# Patient Record
Sex: Male | Born: 1985 | Race: White | Hispanic: No | Smoking: Former smoker
Health system: Southern US, Community
[De-identification: ages and names within clinical notes are randomized; demographics above are authoritative.]

## PROBLEM LIST (undated history)

## (undated) DIAGNOSIS — S3992XA Unspecified injury of lower back, initial encounter: Secondary | ICD-10-CM

## (undated) DIAGNOSIS — M549 Dorsalgia, unspecified: Secondary | ICD-10-CM

---

## 2006-10-20 ENCOUNTER — Emergency Department (HOSPITAL_COMMUNITY): Admission: EM | Admit: 2006-10-20 | Discharge: 2006-10-21 | Payer: Self-pay | Admitting: Emergency Medicine

## 2006-10-22 ENCOUNTER — Emergency Department (HOSPITAL_COMMUNITY): Admission: EM | Admit: 2006-10-22 | Discharge: 2006-10-22 | Payer: Self-pay | Admitting: Emergency Medicine

## 2007-01-14 ENCOUNTER — Emergency Department (HOSPITAL_COMMUNITY): Admission: EM | Admit: 2007-01-14 | Discharge: 2007-01-14 | Payer: Self-pay | Admitting: Emergency Medicine

## 2007-05-23 ENCOUNTER — Emergency Department (HOSPITAL_COMMUNITY): Admission: EM | Admit: 2007-05-23 | Discharge: 2007-05-23 | Payer: Self-pay | Admitting: Emergency Medicine

## 2010-11-07 ENCOUNTER — Emergency Department (HOSPITAL_COMMUNITY)
Admission: EM | Admit: 2010-11-07 | Discharge: 2010-11-07 | Disposition: A | Discharge status: Home / Self Care | Payer: Medicaid Other | Attending: Emergency Medicine | Admitting: Emergency Medicine

## 2010-11-07 ENCOUNTER — Emergency Department (HOSPITAL_COMMUNITY): Payer: Medicaid Other

## 2010-11-07 DIAGNOSIS — R059 Cough, unspecified: Secondary | ICD-10-CM | POA: Insufficient documentation

## 2010-11-07 DIAGNOSIS — J189 Pneumonia, unspecified organism: Secondary | ICD-10-CM | POA: Insufficient documentation

## 2010-11-07 DIAGNOSIS — F17200 Nicotine dependence, unspecified, uncomplicated: Secondary | ICD-10-CM | POA: Insufficient documentation

## 2010-11-07 DIAGNOSIS — R071 Chest pain on breathing: Secondary | ICD-10-CM | POA: Insufficient documentation

## 2010-11-07 DIAGNOSIS — J40 Bronchitis, not specified as acute or chronic: Secondary | ICD-10-CM | POA: Insufficient documentation

## 2010-11-07 DIAGNOSIS — R05 Cough: Secondary | ICD-10-CM | POA: Insufficient documentation

## 2011-01-22 ENCOUNTER — Other Ambulatory Visit (HOSPITAL_COMMUNITY): Payer: Self-pay | Admitting: Internal Medicine

## 2011-01-22 ENCOUNTER — Other Ambulatory Visit (HOSPITAL_COMMUNITY): Payer: Self-pay | Admitting: Family Medicine

## 2011-01-22 ENCOUNTER — Ambulatory Visit (HOSPITAL_COMMUNITY)
Admission: RE | Admit: 2011-01-22 | Discharge: 2011-01-22 | Disposition: A | Discharge status: Home / Self Care | Payer: Medicaid Other | Source: Ambulatory Visit | Attending: Family Medicine | Admitting: Family Medicine

## 2011-01-22 DIAGNOSIS — X58XXXA Exposure to other specified factors, initial encounter: Secondary | ICD-10-CM | POA: Insufficient documentation

## 2011-01-22 DIAGNOSIS — R52 Pain, unspecified: Secondary | ICD-10-CM

## 2011-01-22 DIAGNOSIS — S6990XA Unspecified injury of unspecified wrist, hand and finger(s), initial encounter: Secondary | ICD-10-CM | POA: Insufficient documentation

## 2011-01-22 DIAGNOSIS — R609 Edema, unspecified: Secondary | ICD-10-CM

## 2011-01-22 DIAGNOSIS — M25549 Pain in joints of unspecified hand: Secondary | ICD-10-CM | POA: Insufficient documentation

## 2011-12-15 ENCOUNTER — Emergency Department (HOSPITAL_COMMUNITY)
Admission: EM | Admit: 2011-12-15 | Discharge: 2011-12-15 | Disposition: A | Discharge status: Home / Self Care | Payer: Medicaid Other | Attending: Emergency Medicine | Admitting: Emergency Medicine

## 2011-12-15 ENCOUNTER — Encounter (HOSPITAL_COMMUNITY): Payer: Self-pay | Admitting: *Deleted

## 2011-12-15 DIAGNOSIS — W268XXA Contact with other sharp object(s), not elsewhere classified, initial encounter: Secondary | ICD-10-CM | POA: Insufficient documentation

## 2011-12-15 DIAGNOSIS — R05 Cough: Secondary | ICD-10-CM | POA: Insufficient documentation

## 2011-12-15 DIAGNOSIS — R059 Cough, unspecified: Secondary | ICD-10-CM | POA: Insufficient documentation

## 2011-12-15 DIAGNOSIS — IMO0002 Reserved for concepts with insufficient information to code with codable children: Secondary | ICD-10-CM

## 2011-12-15 DIAGNOSIS — S61209A Unspecified open wound of unspecified finger without damage to nail, initial encounter: Secondary | ICD-10-CM | POA: Insufficient documentation

## 2011-12-15 MED ORDER — KETOROLAC TROMETHAMINE 10 MG PO TABS
10.0000 mg | ORAL_TABLET | Freq: Once | ORAL | Status: AC
  Administered 2011-12-15: 10 mg via ORAL
  Filled 2011-12-15: qty 1

## 2011-12-15 MED ORDER — KETOROLAC TROMETHAMINE 10 MG PO TABS: ORAL_TABLET | ORAL | Status: DC

## 2011-12-15 NOTE — ED Provider Notes (Signed)
History     CSN: [id number]  Arrival date & time 12/15/11  1403   First MD Initiated Contact with Patient 12/15/11 1551      Chief Complaint  Patient presents with  . Laceration    (Consider location/radiation/quality/duration/timing/severity/associated sxs/prior treatment) Patient is a 26 y.o. male presenting with skin laceration. The history is provided by the patient.  Laceration  The incident occurred 3 to 5 hours ago. The laceration is located on the right hand. The laceration is 1 cm in size. The laceration mechanism was a a metal edge. The pain is moderate. The pain has been constant since onset. He reports no foreign bodies present. His tetanus status is UTD.    History reviewed. No pertinent past medical history.  History reviewed. No pertinent past surgical history.  History reviewed. No pertinent family history.  History  Substance Use Topics  . Smoking status: Current Everyday Smoker  . Smokeless tobacco: Not on file  . Alcohol Use: No      Review of Systems  Constitutional: Negative for activity change.       All ROS Neg except as noted in HPI  HENT: Negative for nosebleeds and neck pain.   Eyes: Negative for photophobia and discharge.  Respiratory: Positive for cough. Negative for shortness of breath and wheezing.   Cardiovascular: Negative for chest pain and palpitations.  Gastrointestinal: Negative for abdominal pain and blood in stool.  Genitourinary: Negative for dysuria, frequency and hematuria.  Musculoskeletal: Negative for back pain and arthralgias.  Skin: Negative.   Neurological: Negative for dizziness, seizures and speech difficulty.  Psychiatric/Behavioral: Negative for hallucinations and confusion.    Allergies  Poison sumac extract  Home Medications   Current Outpatient Rx  Name Route Sig Dispense Refill  . KETOROLAC TROMETHAMINE 10 MG PO TABS  1 po tid for soreness 12 tablet 0    BP 115/51  Pulse 72  Temp 98.1 F (36.7 C)  (Oral)  Resp 20  Ht 5\' 9"  (1.753 m)  Wt 130 lb (58.968 kg)  BMI 19.20 kg/m2  SpO2 100%  Physical Exam  Nursing note and vitals reviewed. Constitutional: He is oriented to person, place, and time. He appears well-developed and well-nourished.  Non-toxic appearance.  HENT:  Head: Normocephalic.  Right Ear: Tympanic membrane and external ear normal.  Left Ear: Tympanic membrane and external ear normal.  Eyes: EOM and lids are normal. Pupils are equal, round, and reactive to light.  Neck: Normal range of motion. Neck supple. Carotid bruit is not present.  Cardiovascular: Normal rate, regular rhythm, normal heart sounds, intact distal pulses and normal pulses.   Pulmonary/Chest: No respiratory distress. He has rhonchi.  Abdominal: Soft. Bowel sounds are normal. There is no tenderness. There is no guarding.  Musculoskeletal: Normal range of motion.       Hands: Lymphadenopathy:       Head (right side): No submandibular adenopathy present.       Head (left side): No submandibular adenopathy present.    He has no cervical adenopathy.  Neurological: He is alert and oriented to person, place, and time. He has normal strength. No cranial nerve deficit or sensory deficit.  Skin: Skin is warm and dry.  Psychiatric: He has a normal mood and affect. His speech is normal.    ED Course  LACERATION REPAIR Performed by: Kathie Dike Authorized by: Kathie Dike Consent: Verbal consent obtained. Risks and benefits: risks, benefits and alternatives were discussed Consent given by: patient Patient  understanding: patient states understanding of the procedure being performed Patient identity confirmed: verbally with patient Time out: Immediately prior to procedure a "time out" was called to verify the correct patient, procedure, equipment, support staff and site/side marked as required. Body area: upper extremity Location details: right small finger Laceration length: 1 cm Foreign bodies:  no foreign bodies Tendon involvement: none Nerve involvement: none Vascular damage: no Preparation: Patient was prepped and draped in the usual sterile fashion. Irrigation solution: tap water Amount of cleaning: standard Debridement: none Degree of undermining: none Skin closure: glue Approximation: close Patient tolerance: Patient tolerated the procedure well with no immediate complications.   (including critical care time)  Labs Reviewed - No data to display No results found.   1. Laceration       MDM  I have reviewed nursing notes, vital signs, and all appropriate lab and imaging results for this patient. Pt advised to keep the wound clean and dry. He is to return if any problem with the dermabond, or signs of infection.       Kathie Dike, PA 12/15/11 1625  Kathie Dike, [place] 12/15/11 [phone]

## 2011-12-15 NOTE — ED Notes (Signed)
Lac to rt little finger ,cut on metal,

## 2011-12-15 NOTE — Discharge Instructions (Signed)
Your wound was repaired with Dermabond. This will come off on its own in 5-7 days. Please do not get this area wet tonight. Please return if any changes consistent with infection.

## 2011-12-16 NOTE — ED Provider Notes (Signed)
Medical screening examination/treatment/procedure(s) were performed by non-physician practitioner and as supervising physician I was immediately available for consultation/collaboration.   Shelda Jakes, MD 12/16/11 [phone]

## 2012-08-09 ENCOUNTER — Encounter (HOSPITAL_COMMUNITY): Payer: Self-pay | Admitting: *Deleted

## 2012-08-09 ENCOUNTER — Emergency Department (HOSPITAL_COMMUNITY)
Admission: EM | Admit: 2012-08-09 | Discharge: 2012-08-09 | Disposition: A | Discharge status: Home / Self Care | Payer: Self-pay | Attending: Emergency Medicine | Admitting: Emergency Medicine

## 2012-08-09 DIAGNOSIS — L03211 Cellulitis of face: Secondary | ICD-10-CM | POA: Insufficient documentation

## 2012-08-09 DIAGNOSIS — F17200 Nicotine dependence, unspecified, uncomplicated: Secondary | ICD-10-CM | POA: Insufficient documentation

## 2012-08-09 DIAGNOSIS — L0201 Cutaneous abscess of face: Secondary | ICD-10-CM | POA: Insufficient documentation

## 2012-08-09 MED ORDER — HYDROCODONE-ACETAMINOPHEN 5-325 MG PO TABS: 2.0000 | ORAL_TABLET | Freq: Once | ORAL | Status: DC

## 2012-08-09 MED ORDER — CEPHALEXIN 500 MG PO CAPS: 500.0000 mg | ORAL_CAPSULE | Freq: Four times a day (QID) | ORAL | Status: DC

## 2012-08-09 MED ORDER — LIDOCAINE-EPINEPHRINE 1 %-1:200000 IJ SOLN
10.0000 mL | Freq: Once | INTRAMUSCULAR | Status: AC
Start: 2012-08-09 — End: 2012-08-09
  Administered 2012-08-09: 10 mL via INTRADERMAL
  Filled 2012-08-09: qty 10

## 2012-08-09 MED ORDER — HYDROCODONE-ACETAMINOPHEN 5-325 MG PO TABS
2.0000 | ORAL_TABLET | Freq: Once | ORAL | Status: AC
  Administered 2012-08-09: 2 via ORAL
  Filled 2012-08-09: qty 2

## 2012-08-09 MED ORDER — CEPHALEXIN 500 MG PO CAPS
500.0000 mg | ORAL_CAPSULE | Freq: Once | ORAL | Status: AC
  Administered 2012-08-09: 500 mg via ORAL
  Filled 2012-08-09: qty 1

## 2012-08-09 MED ORDER — SULFAMETHOXAZOLE-TMP DS 800-160 MG PO TABS
1.0000 | ORAL_TABLET | Freq: Once | ORAL | Status: AC
  Administered 2012-08-09: 1 via ORAL
  Filled 2012-08-09: qty 1

## 2012-08-09 MED ORDER — SULFAMETHOXAZOLE-TMP DS 800-160 MG PO TABS: 1.0000 | ORAL_TABLET | Freq: Once | ORAL | Status: DC

## 2012-08-09 NOTE — ED Notes (Signed)
Abscess to lt cheek, x 4 days

## 2012-08-09 NOTE — ED Provider Notes (Signed)
I  reviewed the resident's note and I agree with the findings and plan.   Benny Lennert, MD 08/09/12 [phone]

## 2012-08-09 NOTE — ED Provider Notes (Signed)
History     CSN: [id number]  Arrival date & time 08/09/12  2136   First MD Initiated Contact with Patient 08/09/12 2154      Chief Complaint  Patient presents with  . Abscess    (Consider location/radiation/quality/duration/timing/severity/associated sxs/prior treatment) Patient is a 27 y.o. male presenting with abscess. The history is provided by the patient.  Abscess Location:  Face Facial abscess location:  L cheek Size:  4 cm Abscess quality: fluctuance, induration, painful and warmth   Abscess quality: not draining and no redness   Red streaking: no   Duration:  4 days Progression:  Improving Pain details:    Quality:  Dull and aching   Severity:  Moderate   Duration:  4 days   History reviewed. No pertinent past medical history.  History reviewed. No pertinent past surgical history.  History reviewed. No pertinent family history.  History  Substance Use Topics  . Smoking status: Current Every Day Smoker  . Smokeless tobacco: Not on file  . Alcohol Use: No      Review of Systems  All other systems reviewed and are negative.    Allergies  Poison sumac extract  Home Medications  No current outpatient prescriptions on file.  BP 118/79  Pulse 95  Temp(Src) 98.9 F (37.2 C) (Oral)  Ht 5\' 9"  (1.753 m)  Wt 120 lb (54.432 kg)  BMI 17.71 kg/m2  SpO2 99%  Physical Exam  Constitutional: He is oriented to person, place, and time. He appears well-developed and well-nourished. No distress.  HENT:  Head: Normocephalic and atraumatic.  Nose:    Mouth/Throat: No oropharyngeal exudate.  Eyes: EOM are normal. Pupils are equal, round, and reactive to light.  Neck: Normal range of motion. Neck supple.  Cardiovascular: Normal rate and regular rhythm.  Exam reveals no friction rub.   No murmur heard. Pulmonary/Chest: Effort normal and breath sounds normal. No respiratory distress. He has no wheezes. He has no rales.  Abdominal: He exhibits no distension.  There is no tenderness. There is no rebound.  Musculoskeletal: Normal range of motion. He exhibits no edema.  Neurological: He is alert and oriented to person, place, and time.  Skin: He is not diaphoretic.    ED Course  INCISION AND DRAINAGE Date/Time: 08/09/2012 11:09 PM Performed by: Elwin Mocha Authorized by: Benny Lennert Consent: Verbal consent obtained. Type: abscess Body area: head/neck Location details: face Anesthesia: local infiltration Local anesthetic: lidocaine 1% with epinephrine Anesthetic total: 4 ml Patient sedated: no Risk factor: underlying major vessel Scalpel size: 11 Incision type: single straight Complexity: simple Drainage: purulent Drainage amount: scant Wound treatment: wound left open Patient tolerance: Patient tolerated the procedure well with no immediate complications.   (including critical care time)  Labs Reviewed - No data to display No results found.   1. Abscess of left external cheek       MDM   27 year old male with a left facial abscess. Present for 4 days. States he popped it and it was draining some, however unable to continue pumping and continue draining. No fevers, nausea, vomiting. No other systemic symptoms. No regional lymphadenopathy. I&D as above. We'll place on antibiotics and give pain medicine.  Elwin Mocha, MD 08/09/12 [phone]

## 2012-08-09 NOTE — ED Notes (Signed)
MD at bedside. For I&D to left cheek of face

## 2012-08-24 ENCOUNTER — Encounter (HOSPITAL_COMMUNITY): Payer: Self-pay | Admitting: *Deleted

## 2012-08-24 ENCOUNTER — Emergency Department (HOSPITAL_COMMUNITY)
Admission: EM | Admit: 2012-08-24 | Discharge: 2012-08-24 | Disposition: A | Discharge status: Home / Self Care | Payer: Self-pay | Attending: Emergency Medicine | Admitting: Emergency Medicine

## 2012-08-24 DIAGNOSIS — Y929 Unspecified place or not applicable: Secondary | ICD-10-CM | POA: Insufficient documentation

## 2012-08-24 DIAGNOSIS — W208XXA Other cause of strike by thrown, projected or falling object, initial encounter: Secondary | ICD-10-CM | POA: Insufficient documentation

## 2012-08-24 DIAGNOSIS — Y939 Activity, unspecified: Secondary | ICD-10-CM | POA: Insufficient documentation

## 2012-08-24 DIAGNOSIS — S61409A Unspecified open wound of unspecified hand, initial encounter: Secondary | ICD-10-CM | POA: Insufficient documentation

## 2012-08-24 DIAGNOSIS — Z79899 Other long term (current) drug therapy: Secondary | ICD-10-CM | POA: Insufficient documentation

## 2012-08-24 DIAGNOSIS — F17200 Nicotine dependence, unspecified, uncomplicated: Secondary | ICD-10-CM | POA: Insufficient documentation

## 2012-08-24 DIAGNOSIS — S61412A Laceration without foreign body of left hand, initial encounter: Secondary | ICD-10-CM

## 2012-08-24 MED ORDER — LIDOCAINE HCL (PF) 1 % IJ SOLN
INTRAMUSCULAR | Status: AC
  Filled 2012-08-24: qty 5

## 2012-08-24 MED ORDER — HYDROCODONE-ACETAMINOPHEN 5-325 MG PO TABS: 1.0000 | ORAL_TABLET | ORAL | Status: DC | PRN

## 2012-08-24 NOTE — ED Provider Notes (Signed)
History     CSN: [id number]  Arrival date & time 08/24/12  1350   First MD Initiated Contact with Patient 08/24/12 1412      Chief Complaint  Patient presents with  . Laceration    (Consider location/radiation/quality/duration/timing/severity/associated sxs/prior treatment) Patient is a 27 y.o. male presenting with skin laceration. The history is provided by the patient.  Laceration Location:  Hand Hand laceration location:  L finger Length (cm):  2 Quality: straight   Bleeding: controlled   Laceration mechanism:  Blunt object (transmission) Pain details:    Quality:  Aching   Severity:  Moderate   Timing:  Constant   Progression:  Worsening Foreign body present:  No foreign bodies Relieved by:  Nothing Worsened by:  Nothing tried Ineffective treatments:  None tried Tetanus status:  Up to date   History reviewed. No pertinent past medical history.  History reviewed. No pertinent past surgical history.  History reviewed. No pertinent family history.  History  Substance Use Topics  . Smoking status: Current Every Day Smoker  . Smokeless tobacco: Not on file  . Alcohol Use: No      Review of Systems  Constitutional: Negative for activity change.       All ROS Neg except as noted in HPI  HENT: Negative for nosebleeds and neck pain.   Eyes: Negative for photophobia and discharge.  Respiratory: Negative for cough, shortness of breath and wheezing.   Cardiovascular: Negative for chest pain and palpitations.  Gastrointestinal: Negative for abdominal pain and blood in stool.  Genitourinary: Negative for dysuria, frequency and hematuria.  Musculoskeletal: Negative for back pain and arthralgias.  Skin: Negative.   Neurological: Negative for dizziness, seizures and speech difficulty.  Psychiatric/Behavioral: Negative for hallucinations and confusion.    Allergies  Poison sumac extract  Home Medications   Current Outpatient Rx  Name  Route  Sig  Dispense   Refill  . HYDROcodone-acetaminophen (NORCO/VICODIN) 5-325 MG per tablet   Oral   Take 2 tablets by mouth once.   20 tablet   0   . cephALEXin (KEFLEX) 500 MG capsule   Oral   Take 1 capsule (500 mg total) by mouth 4 (four) times daily.   40 capsule   0   . sulfamethoxazole-trimethoprim (BACTRIM DS) 800-160 MG per tablet   Oral   Take 1 tablet by mouth once.   20 tablet   0     BP 113/91  Pulse 99  Temp(Src) 97.9 F (36.6 C) (Oral)  Resp 16  Ht 5\' 9"  (1.753 m)  Wt 130 lb (58.968 kg)  BMI 19.19 kg/m2  SpO2 96%  Physical Exam  Nursing note and vitals reviewed. Constitutional: He is oriented to person, place, and time. He appears well-developed and well-nourished.  Non-toxic appearance.  HENT:  Head: Normocephalic.  Right Ear: Tympanic membrane and external ear normal.  Left Ear: Tympanic membrane and external ear normal.  Eyes: EOM and lids are normal. Pupils are equal, round, and reactive to light.  Neck: Normal range of motion. Neck supple. Carotid bruit is not present.  Cardiovascular: Normal rate, regular rhythm, normal heart sounds, intact distal pulses and normal pulses.   Pulmonary/Chest: Breath sounds normal. No respiratory distress.  Scattered rhonchi present.  Abdominal: Soft. Bowel sounds are normal. There is no tenderness. There is no guarding.  Musculoskeletal: Normal range of motion.  Patient has a one to 2 cm laceration of the palmar surface of the middle finger of the left hand at  the base. There is no bone or tendon involvement. There is full range of motion of the middle finger on the left. Sensory is intact. There is an abrasion to the dorsal third MP joint. There is no palpable deformity of the left wrist or the left hand. Capillary refill is less than 3 seconds.  Lymphadenopathy:       Head (right side): No submandibular adenopathy present.       Head (left side): No submandibular adenopathy present.    He has no cervical adenopathy.  Neurological:  He is alert and oriented to person, place, and time. He has normal strength. No cranial nerve deficit or sensory deficit.  Skin: Skin is warm and dry.  Psychiatric: He has a normal mood and affect. His speech is normal.    ED Course  Procedures  : FINGER LACERATION REPAIR. Patient identified by arm band. Permission for the procedure is given by the patient. Procedural time out was taken before repair of laceration to the third finger of the left hand. The left hand was soaked in a solution of Betadine and saline 6-7 minutes. The hand was then cleansed of debris, and the laceration was infiltrated with 1% plain lidocaine. After adequate anesthetic, the wound was irrigated with saline, it was inspected, no foreign body was noted, no bone or tendon involvement noted. The wound was then repaired with 2 interrupted sutures of 4-0 nylon. The wound measures  1.3CM. sterile dressing was applied by me. Patient tolerated the procedure without problem. The patient is up-to-date on his tetanus.  Labs Reviewed - No data to display No results found.   No diagnosis found.    MDM  I have reviewed nursing notes, vital signs, and all appropriate lab and imaging results for this patient. Patient states he had a" transmission from a car" to perform partially on his left hand. He sustained a laceration to the hand. The area was slow to stop bleeding it was causing him some pain and he came to the emergency department for evaluation. The wound is cleansed and then repaired. The patient's tetanus status is up-to-date. The patient is placed on 10 tablets of Norco, one every 4 hours for soreness. Patient is to have the sutures removed in 7 days. Patient is to return sooner if any changes or problems consistent with infection.       Kathie Dike, PA-C 08/25/12 [phone]

## 2012-08-24 NOTE — ED Notes (Signed)
Pt states a transmission of a car fell on hand, lac noted to middle finger of left hand

## 2012-08-24 NOTE — ED Notes (Addendum)
Lac to LMF when working on a transmission. Today.  Lac to proximal phalanx , palmar surface.  Band aid by Loney Laurence after  Suturing.

## 2012-08-25 NOTE — ED Provider Notes (Signed)
Medical screening examination/treatment/procedure(s) were performed by non-physician practitioner and as supervising physician I was immediately available for consultation/collaboration.   Joya Gaskins, MD 08/25/12 1052

## 2012-10-01 ENCOUNTER — Emergency Department (HOSPITAL_COMMUNITY)
Admission: EM | Admit: 2012-10-01 | Discharge: 2012-10-01 | Disposition: A | Discharge status: Home / Self Care | Payer: Self-pay | Attending: Emergency Medicine | Admitting: Emergency Medicine

## 2012-10-01 ENCOUNTER — Emergency Department (HOSPITAL_COMMUNITY): Payer: Self-pay

## 2012-10-01 ENCOUNTER — Encounter (HOSPITAL_COMMUNITY): Payer: Self-pay | Admitting: Emergency Medicine

## 2012-10-01 DIAGNOSIS — S6990XA Unspecified injury of unspecified wrist, hand and finger(s), initial encounter: Secondary | ICD-10-CM | POA: Insufficient documentation

## 2012-10-01 DIAGNOSIS — Y929 Unspecified place or not applicable: Secondary | ICD-10-CM | POA: Insufficient documentation

## 2012-10-01 DIAGNOSIS — F17200 Nicotine dependence, unspecified, uncomplicated: Secondary | ICD-10-CM | POA: Insufficient documentation

## 2012-10-01 DIAGNOSIS — S6980XA Other specified injuries of unspecified wrist, hand and finger(s), initial encounter: Secondary | ICD-10-CM | POA: Insufficient documentation

## 2012-10-01 DIAGNOSIS — W208XXA Other cause of strike by thrown, projected or falling object, initial encounter: Secondary | ICD-10-CM | POA: Insufficient documentation

## 2012-10-01 DIAGNOSIS — S6992XD Unspecified injury of left wrist, hand and finger(s), subsequent encounter: Secondary | ICD-10-CM

## 2012-10-01 DIAGNOSIS — Y939 Activity, unspecified: Secondary | ICD-10-CM | POA: Insufficient documentation

## 2012-10-01 DIAGNOSIS — IMO0002 Reserved for concepts with insufficient information to code with codable children: Secondary | ICD-10-CM

## 2012-10-01 NOTE — ED Notes (Signed)
Pt states he was seen here about a month ago for injury to his L middle finger. Pt states a transmission fell on his hand. Pt had lac that required sutures. Pt states he has had edema and pain since.

## 2012-10-03 NOTE — ED Provider Notes (Signed)
History     CSN: [id number]  Arrival date & time 10/01/12  1551   First MD Initiated Contact with Patient 10/01/12 1612      Chief Complaint  Patient presents with  . Finger Injury    (Consider location/radiation/quality/duration/timing/severity/associated sxs/prior treatment) HPI Comments: Patient c/o continued pain and edema to the left third finger.  States he was seen here approximately one month ago and treated for a laceration to the proximal left third finger.  He states the wound healed, but continues to have swelling of the finger and difficulty fully flexing the finger.  He denies numbness of the finger, wrist pain, or erythema.  He states he did not f/u with orthopedics.  Patient is a 27 y.o. male presenting with hand pain. The history is provided by the patient.  Hand Pain This is a chronic problem. The current episode started more than 1 month ago. The problem occurs constantly. The problem has been unchanged. Associated symptoms include arthralgias and joint swelling. Pertinent negatives include no chills, fever, headaches, neck pain, numbness, rash, sore throat, vomiting or weakness. Exacerbated by: movement. He has tried nothing for the symptoms. The treatment provided no relief.    History reviewed. No pertinent past medical history.  History reviewed. No pertinent past surgical history.  History reviewed. No pertinent family history.  History  Substance Use Topics  . Smoking status: Current Every Day Smoker  . Smokeless tobacco: Not on file  . Alcohol Use: No      Review of Systems  Constitutional: Negative for fever and chills.  HENT: Negative for sore throat and neck pain.   Gastrointestinal: Negative for vomiting.  Genitourinary: Negative for dysuria and difficulty urinating.  Musculoskeletal: Positive for joint swelling and arthralgias. Negative for back pain and gait problem.  Skin: Negative for color change, rash and wound.  Neurological: Negative  for weakness, numbness and headaches.  All other systems reviewed and are negative.    Allergies  Poison sumac extract  Home Medications   Current Outpatient Rx  Name  Route  Sig  Dispense  Refill  . acetaminophen (TYLENOL) 500 MG tablet   Oral   Take 1,000 mg by mouth daily as needed for pain.         Marland Kitchen ibuprofen (ADVIL,MOTRIN) 200 MG tablet   Oral   Take 400-600 mg by mouth daily as needed for pain.           BP 121/65  Pulse 67  Temp(Src) 98.2 F (36.8 C) (Oral)  Ht 5\' 9"  (1.753 m)  Wt 140 lb (63.504 kg)  BMI 20.67 kg/m2  SpO2 100%  Physical Exam  Nursing note and vitals reviewed. Constitutional: He is oriented to person, place, and time. He appears well-developed and well-nourished. No distress.  HENT:  Head: Normocephalic and atraumatic.  Cardiovascular: Normal rate, regular rhythm and normal heart sounds.   Pulmonary/Chest: Effort normal and breath sounds normal.  Musculoskeletal: He exhibits edema and tenderness.       Left hand: He exhibits decreased range of motion, tenderness and swelling. He exhibits no bony tenderness, normal two-point discrimination, normal capillary refill, no deformity and no laceration. Normal sensation noted. Decreased strength noted. He exhibits no thumb/finger opposition and no wrist extension trouble.       Hands: Mild ttp of the left third finger with attempted flexion.  Mild STS of the DIP joint.   Radial pulse is brisk, distal sensation intact.  CR< 2 sec.  No bruising or bony  deformity.  Previous laceration appears well healed.    Neurological: He is alert and oriented to person, place, and time. He exhibits normal muscle tone. Coordination normal.  Skin: Skin is warm and dry.    ED Course  Procedures (including critical care time)  Labs Reviewed - No data to display No results found. Dg Finger Middle Left  10/01/2012  *RADIOLOGY REPORT*  Clinical Data: Finger injury.  LEFT MIDDLE FINGER 2+V  Comparison: None  Findings:  No acute bony abnormality.  Specifically, no fracture, subluxation, or dislocation.  Soft tissues are intact. Joint spaces are maintained.  Normal bone mineralization.  IMPRESSION: Normal study.   Original Report Authenticated By: Charlett Nose, M.D.     1. Finger injury, left, subsequent encounter   2. Tendon injury     Left middle finger splinted,  Pain improved.  NV intact  MDM    Patient with likely flexion tendon injury secondary to previous laceration.  X-ray negative for bony injury. No clinical sx's of infection.   Will splint finger and pt agrees to f/u with orthopedics.  Given referral info.    The patient appears reasonably screened and/or stabilized for discharge and I doubt any other medical condition or other [facility] requiring further screening, evaluation, or treatment in the ED at this time prior to discharge.       Creola Krotz L. Trisha Mangle, PA-C 10/03/12 2005

## 2012-10-06 NOTE — ED Provider Notes (Signed)
Medical screening examination/treatment/procedure(s) were performed by non-physician practitioner and as supervising physician I was immediately available for consultation/collaboration. Devoria Albe, MD, FACEP   Ward Givens, MD 10/06/12 1053

## 2013-03-02 ENCOUNTER — Emergency Department (HOSPITAL_COMMUNITY)
Admission: EM | Admit: 2013-03-02 | Discharge: 2013-03-02 | Disposition: A | Discharge status: Home / Self Care | Payer: Self-pay | Attending: Emergency Medicine | Admitting: Emergency Medicine

## 2013-03-02 ENCOUNTER — Encounter (HOSPITAL_COMMUNITY): Payer: Self-pay

## 2013-03-02 DIAGNOSIS — Y929 Unspecified place or not applicable: Secondary | ICD-10-CM | POA: Insufficient documentation

## 2013-03-02 DIAGNOSIS — S8990XA Unspecified injury of unspecified lower leg, initial encounter: Secondary | ICD-10-CM | POA: Insufficient documentation

## 2013-03-02 DIAGNOSIS — F17200 Nicotine dependence, unspecified, uncomplicated: Secondary | ICD-10-CM | POA: Insufficient documentation

## 2013-03-02 DIAGNOSIS — L03039 Cellulitis of unspecified toe: Secondary | ICD-10-CM | POA: Insufficient documentation

## 2013-03-02 DIAGNOSIS — W208XXA Other cause of strike by thrown, projected or falling object, initial encounter: Secondary | ICD-10-CM | POA: Insufficient documentation

## 2013-03-02 DIAGNOSIS — Y9389 Activity, other specified: Secondary | ICD-10-CM | POA: Insufficient documentation

## 2013-03-02 DIAGNOSIS — L03012 Cellulitis of left finger: Secondary | ICD-10-CM

## 2013-03-02 MED ORDER — IBUPROFEN 800 MG PO TABS
800.0000 mg | ORAL_TABLET | Freq: Once | ORAL | Status: AC
  Administered 2013-03-02: 800 mg via ORAL
  Filled 2013-03-02: qty 1

## 2013-03-02 MED ORDER — CEPHALEXIN 500 MG PO CAPS: 500.0000 mg | ORAL_CAPSULE | Freq: Four times a day (QID) | ORAL | Status: DC

## 2013-03-02 NOTE — ED Notes (Signed)
Pt reports left great toe pain for 2 days, denies any known injury

## 2013-03-02 NOTE — ED Provider Notes (Signed)
CSN: [id number]     Arrival date & time 03/02/13  4098 History  This chart was scribed for Candyce Churn, MD by Quintella Reichert, ED scribe.  This patient was seen in room [facility]11/[facility]11 and the patient's care was started at 8:49 AM.   Chief Complaint  Patient presents with  . Toe Pain    Patient is a 27 y.o. male presenting with foot injury. The history is provided by the patient. No language interpreter was used.  Foot Injury Location:  Toe Time since incident:  5 days Injury: yes   Mechanism of injury comment:  Dropped a floor jack on the toe Toe location:  L big toe Pain details:    Quality:  Throbbing   Severity:  Moderate   Duration:  2 days   Timing:  Constant Chronicity:  New Dislocation: no   Foreign body present:  No foreign bodies Relieved by:  Nothing Worsened by:  Bearing weight Ineffective treatments:  None tried Associated symptoms: no fever, no muscle weakness, no numbness and no tingling     HPI Comments: Edward Krause is a 27 y.o. male who presents to the Emergency Department complaining of gradual-onset, gradually-worsening left great toe pain that began 2 days ago.  5 days ago pt dropped a floor jack on the toe.  He did not immediately develop symptoms but 2 days ago he developed constant, moderate-to-severe throbbing pain to the toe that is greatly exacerbated by bearing weight.  Presently he rates pain at a severity of 8/10.  He also notes some mild visible swelling to the toe.  He denies joint pain, weakness, numbness or tingling.  He denies fever.   History reviewed. No pertinent past medical history.  History reviewed. No pertinent past surgical history.  No family history on file.  History  Substance Use Topics  . Smoking status: Current Every Day Smoker  . Smokeless tobacco: Not on file  . Alcohol Use: Yes     Comment: occ     Review of Systems  Constitutional: Negative for fever.  Musculoskeletal: Negative for arthralgias.       Left  great toe pain  Neurological: Negative for weakness and numbness.  All other systems reviewed and are negative.     Allergies  Poison sumac extract  Home Medications  No current outpatient prescriptions on file.  BP 117/75  Pulse 75  Temp(Src) 97.4 F (36.3 C) (Oral)  Resp 20  SpO2 100%  Physical Exam  Nursing note and vitals reviewed. Constitutional: He is oriented to person, place, and time. He appears well-developed and well-nourished. No distress.  HENT:  Head: Normocephalic and atraumatic.  Eyes: Conjunctivae are normal. No scleral icterus.  Neck: Neck supple.  Cardiovascular: Normal rate and intact distal pulses.   Pulmonary/Chest: Effort normal. No stridor. No respiratory distress.  Abdominal: Normal appearance. He exhibits no distension.  Neurological: He is alert and oriented to person, place, and time.  Skin: Skin is warm and dry. No rash noted.  Mild amount of erythema on lateral aspect of left big toe, no significant swelling, no fluctuance.  Psychiatric: He has a normal mood and affect. His behavior is normal.    ED Course  Procedures (including critical care time)  DIAGNOSTIC STUDIES: Oxygen Saturation is 100% on room air, normal by my interpretation.    COORDINATION OF CARE: 8:52 AM: Informed pt that symptoms are likely due to paronychia. Discussed treatment plan which includes ibuprofen, warm soaks, and return for I&D if  symptoms worsen or pt develops fever.  Pt expressed understanding and agreed to plan.   Labs Review Labs Reviewed - No data to display  Imaging Review No results found.  MDM   1. Paronychia, left    27 year old male with left great toe pain. Exam consistent with developing paronychia. No fluctuance or abscess to be drained. Will treat with warm soaks and Keflex. Return precautions given.    I personally performed the services described in this documentation, which was scribed in my presence. The recorded information has been  reviewed and is accurate.     Candyce Churn, MD 03/02/13 [phone]

## 2013-03-04 ENCOUNTER — Emergency Department (HOSPITAL_COMMUNITY)
Admission: EM | Admit: 2013-03-04 | Discharge: 2013-03-04 | Disposition: A | Discharge status: Home / Self Care | Payer: Self-pay | Attending: Emergency Medicine | Admitting: Emergency Medicine

## 2013-03-04 ENCOUNTER — Encounter (HOSPITAL_COMMUNITY): Payer: Self-pay | Admitting: *Deleted

## 2013-03-04 DIAGNOSIS — L02612 Cutaneous abscess of left foot: Secondary | ICD-10-CM

## 2013-03-04 DIAGNOSIS — L02619 Cutaneous abscess of unspecified foot: Secondary | ICD-10-CM | POA: Insufficient documentation

## 2013-03-04 DIAGNOSIS — F17200 Nicotine dependence, unspecified, uncomplicated: Secondary | ICD-10-CM | POA: Insufficient documentation

## 2013-03-04 DIAGNOSIS — L03039 Cellulitis of unspecified toe: Secondary | ICD-10-CM | POA: Insufficient documentation

## 2013-03-04 DIAGNOSIS — Z792 Long term (current) use of antibiotics: Secondary | ICD-10-CM | POA: Insufficient documentation

## 2013-03-04 MED ORDER — HYDROCODONE-ACETAMINOPHEN 5-325 MG PO TABS: 1.0000 | ORAL_TABLET | ORAL | Status: DC | PRN

## 2013-03-04 MED ORDER — BUPIVACAINE HCL (PF) 0.5 % IJ SOLN
10.0000 mL | Freq: Once | INTRAMUSCULAR | Status: AC
  Administered 2013-03-04: 10 mL
  Filled 2013-03-04: qty 30

## 2013-03-04 NOTE — ED Provider Notes (Signed)
CSN: [id number]     Arrival date & time 03/04/13  1618 History   First MD Initiated Contact with Patient 03/04/13 1652     Chief Complaint  Patient presents with  . Toe Pain   (Consider location/radiation/quality/duration/timing/severity/associated sxs/prior Treatment) HPI Comments: Patient returns after having been seen here 2 days ago for the same thing - reports that he was given abx which have not helped - states that the toe has become more swollen and painful - reports there is a white "head" on the medial aspect of the toe - denies fever or chills but reports pain with ambulation and pressure.  Patient is a 27 y.o. male presenting with toe pain. The history is provided by the patient. No language interpreter was used.  Toe Pain This is a new problem. The current episode started in the past 7 days. The problem occurs constantly. The problem has been gradually worsening. Associated symptoms include arthralgias, joint swelling and myalgias. Pertinent negatives include no anorexia, chest pain, chills, coughing, fever, neck pain, numbness, sore throat, swollen glands, urinary symptoms or weakness. The symptoms are aggravated by bending and walking. He has tried nothing for the symptoms. The treatment provided no relief.    History reviewed. No pertinent past medical history. History reviewed. No pertinent past surgical history. History reviewed. No pertinent family history. History  Substance Use Topics  . Smoking status: Current Every Day Smoker  . Smokeless tobacco: Not on file  . Alcohol Use: Yes     Comment: occ    Review of Systems  Constitutional: Negative for fever and chills.  HENT: Negative for sore throat and neck pain.   Respiratory: Negative for cough.   Cardiovascular: Negative for chest pain.  Gastrointestinal: Negative for anorexia.  Musculoskeletal: Positive for myalgias, joint swelling and arthralgias.  Neurological: Negative for weakness and numbness.  All other  systems reviewed and are negative.    Allergies  Poison sumac extract  Home Medications   Current Outpatient Rx  Name  Route  Sig  Dispense  Refill  . cephALEXin (KEFLEX) 500 MG capsule   Oral   Take 1 capsule (500 mg total) by mouth 4 (four) times daily.   28 capsule   0    BP 112/72  Pulse 89  Temp(Src) 98.1 F (36.7 C) (Oral)  Resp 18  Ht 5\' 9"  (1.753 m)  Wt 120 lb (54.432 kg)  BMI 17.71 kg/m2  SpO2 100% Physical Exam  Nursing note and vitals reviewed. Constitutional: He is oriented to person, place, and time. He appears well-developed and well-nourished. No distress.  HENT:  Head: Normocephalic and atraumatic.  Mouth/Throat: Oropharynx is clear and moist.  Eyes: Conjunctivae are normal. Pupils are equal, round, and reactive to light. No scleral icterus.  Neck: Normal range of motion. Neck supple.  Pulmonary/Chest: Effort normal.  Abdominal: There is no tenderness.  Musculoskeletal: He exhibits edema and tenderness.       Left foot: He exhibits tenderness and swelling. He exhibits no bony tenderness and normal capillary refill.       Feet:  Neurological: He is alert and oriented to person, place, and time. No cranial nerve deficit.  Skin: Skin is warm and dry. No rash noted. There is erythema. No pallor.  Erythema noted to medial aspect of left great toe  Psychiatric: He has a normal mood and affect. His behavior is normal. Judgment and thought content normal.    ED Course  Procedures (including critical care time) Labs Review  Labs Reviewed - No data to display Imaging Review No results found. INCISION AND DRAINAGE Performed by: Cherrie Distance C. Consent: Verbal consent obtained. Risks and benefits: risks, benefits and alternatives were discussed Type: abscess  Body area: left great toe  Anesthesia: digital block  Incision was made with a scalpel.  Local anesthetic: marcaine 0.5% without epi  Anesthetic total: 5 ml  Complexity: complex Blunt  dissection to break up loculations  Drainage: purulent  Drainage amount: small  Packing material: No packing placed  Patient tolerance: Patient tolerated the procedure well with no immediate complications.   MDM  Left great toe abscess  Patient with abscess to left great toe - though no paronychia noted at this time - does not appear to go deep into soft tissue like a felon.  Opened with small amount of drainage - will continue abx and do twice daily soaks.  Will give short course pain medication since I&D   Scarlette Calico C. Marisue Humble, PA-C 03/04/13 1754

## 2013-03-04 NOTE — ED Notes (Signed)
Pain lt great toe

## 2013-03-04 NOTE — ED Provider Notes (Signed)
Medical screening examination/treatment/procedure(s) were performed by non-physician practitioner and as supervising physician I was immediately available for consultation/collaboration.  Donnetta Hutching, MD 03/04/13 [phone]

## 2013-03-27 ENCOUNTER — Emergency Department (HOSPITAL_COMMUNITY)
Admission: EM | Admit: 2013-03-27 | Discharge: 2013-03-27 | Disposition: A | Discharge status: Home / Self Care | Payer: Self-pay | Attending: Emergency Medicine | Admitting: Emergency Medicine

## 2013-03-27 ENCOUNTER — Encounter (HOSPITAL_COMMUNITY): Payer: Self-pay | Admitting: Emergency Medicine

## 2013-03-27 DIAGNOSIS — L03019 Cellulitis of unspecified finger: Secondary | ICD-10-CM | POA: Insufficient documentation

## 2013-03-27 DIAGNOSIS — L02511 Cutaneous abscess of right hand: Secondary | ICD-10-CM

## 2013-03-27 DIAGNOSIS — F17200 Nicotine dependence, unspecified, uncomplicated: Secondary | ICD-10-CM | POA: Insufficient documentation

## 2013-03-27 DIAGNOSIS — L02519 Cutaneous abscess of unspecified hand: Secondary | ICD-10-CM | POA: Insufficient documentation

## 2013-03-27 MED ORDER — LIDOCAINE HCL (PF) 1 % IJ SOLN
INTRAMUSCULAR | Status: AC
  Filled 2013-03-27: qty 5

## 2013-03-27 MED ORDER — HYDROCODONE-ACETAMINOPHEN 5-325 MG PO TABS
1.0000 | ORAL_TABLET | Freq: Once | ORAL | Status: AC
  Administered 2013-03-27: 1 via ORAL
  Filled 2013-03-27: qty 1

## 2013-03-27 MED ORDER — SULFAMETHOXAZOLE-TRIMETHOPRIM 800-160 MG PO TABS: 1.0000 | ORAL_TABLET | Freq: Two times a day (BID) | ORAL | Status: DC

## 2013-03-27 MED ORDER — SULFAMETHOXAZOLE-TMP DS 800-160 MG PO TABS
1.0000 | ORAL_TABLET | Freq: Once | ORAL | Status: AC
  Administered 2013-03-27: 1 via ORAL
  Filled 2013-03-27: qty 1

## 2013-03-27 MED ORDER — HYDROCODONE-ACETAMINOPHEN 5-325 MG PO TABS: ORAL_TABLET | ORAL | Status: DC

## 2013-03-27 MED ORDER — CEPHALEXIN 500 MG PO CAPS: 500.0000 mg | ORAL_CAPSULE | Freq: Four times a day (QID) | ORAL | Status: DC

## 2013-03-27 NOTE — ED Provider Notes (Signed)
CSN: [id number]     Arrival date & time 03/27/13  1528 History   First MD Initiated Contact with Patient 03/27/13 1615     Chief Complaint  Patient presents with  . Abscess   (Consider location/radiation/quality/duration/timing/severity/associated sxs/prior Treatment) HPI Comments: Edward Krause is a 27 y.o. male who presents to the Emergency Department complaining of pain, redness and swelling to the dorsal right thumb for 1.5 weeks.  Stats the area began as a small pimple and has now began painful with movement.  He states he tried squeezing it several days ago and pierced it with a needle this morning, but the area did not bleed or drain.  Pain is worse with attempted movement of the thumb.  He denies fever, chills, or numbness of the finger.  He has hx of similar appearing lesions in the past, but has not been diagnosed with MRSA  Patient is a 27 y.o. male presenting with hand pain. The history is provided by the patient.  Hand Pain This is a new problem. Episode onset: 1.5 weeks ago. The problem occurs constantly. The problem has been gradually worsening. Pertinent negatives include no arthralgias, chills, fever, headaches, joint swelling, nausea, numbness, rash, sore throat, swollen glands, vomiting or weakness. Associated symptoms comments: Pain, redness and localized swelling of the right dorsal thumb. The symptoms are aggravated by bending (palpation). The treatment provided no relief.    History reviewed. No pertinent past medical history. History reviewed. No pertinent past surgical history. History reviewed. No pertinent family history. History  Substance Use Topics  . Smoking status: Current Every Day Smoker  . Smokeless tobacco: Not on file  . Alcohol Use: Yes     Comment: occ    Review of Systems  Constitutional: Negative for fever and chills.  HENT: Negative for sore throat.   Gastrointestinal: Negative for nausea and vomiting.  Musculoskeletal: Negative for  arthralgias and joint swelling.  Skin: Positive for color change. Negative for rash.       Abscess of right thumb  Neurological: Negative for weakness, numbness and headaches.  Hematological: Negative for adenopathy.  All other systems reviewed and are negative.    Allergies  Poison sumac extract  Home Medications  No current outpatient prescriptions on file. BP 119/73  Pulse 93  Temp(Src) 98.5 F (36.9 C) (Oral)  Resp 19  SpO2 100% Physical Exam  Nursing note and vitals reviewed. Constitutional: He is oriented to person, place, and time. He appears well-developed and well-nourished. No distress.  HENT:  Head: Normocephalic and atraumatic.  Cardiovascular: Normal rate, regular rhythm and normal heart sounds.   No murmur heard. Pulmonary/Chest: Effort normal and breath sounds normal. No respiratory distress.  Musculoskeletal: He exhibits tenderness. He exhibits no edema.       Right hand: He exhibits tenderness. He exhibits no bony tenderness and normal two-point discrimination. Normal sensation noted.       Hands: Neurological: He is alert and oriented to person, place, and time. He exhibits normal muscle tone. Coordination normal.  Skin: Skin is warm and dry. There is erythema.  Localized area of induration and erythema of the dorsal aspect of the right mid thumb.  Pt has limited finger/thumb opposition secondary to pain.  Pain also reproduced with full extension.  Distal sensation intact.  CR< 2 sec.  Radial pulse brisk, no lymphangitis, fluctuance or drainage    ED Course  Procedures (including critical care time) Labs Review Labs Reviewed - No data to display Imaging Review No  results found.  Leading edge of erythema was marked by me.  No indication for I&D at this time.    MDM    1635  Pt also seen by EDP, Dr. Fleet Contras, care plan discussed with the patient and includes warm wet soaks 3 times/day, bactrim and keflex, and referral to hand surgeon.  Pt also advised  of signs and sx's for ER return.  Pt agrees to care plan and verbalized understanding  Vihana Kydd L. Trisha Mangle, PA-C 03/27/13 2010

## 2013-03-27 NOTE — ED Notes (Signed)
Pt had bump on posterior right thumb x 1 week ago. Started swelling x 3 days ago. Moderate swelling noted with redness and tightness to right thumb. Unable to bend per pt.

## 2013-03-27 NOTE — ED Notes (Signed)
Patient presents to ED complaining of a "bump" on his right thumb. Patient states that it "just showed up about 1 1/2 ago and I tried to pop it but couldn't". Patient states pain 8/10 on the right thumb as burning and throbbing that radiates up to his elbow. Patient can move all of his fingers and his thumb, but has obvious pain when bending thumb. Size is approx a quarter, reddened and swollen. Sensation in tact. Alert and oriented X4.  Patient in no apparent distress.

## 2013-03-27 NOTE — ED Provider Notes (Signed)
Pt with thumb infection - on dorsal surface - no fluctuance, no ascending redness.  Sx are moderate, worse with palpation.  No fever, no signs of ascending lymphangitis.  Medical screening examination/treatment/procedure(s) were conducted as a shared visit with non-physician practitioner(s) and myself.  I personally evaluated the patient during the encounter.  Abx  Vida Roller, MD 03/27/13 2248

## 2013-07-11 ENCOUNTER — Encounter (HOSPITAL_COMMUNITY): Payer: Self-pay | Admitting: Emergency Medicine

## 2013-07-11 ENCOUNTER — Emergency Department (HOSPITAL_COMMUNITY)
Admission: EM | Admit: 2013-07-11 | Discharge: 2013-07-11 | Disposition: A | Discharge status: Home / Self Care | Payer: Self-pay | Attending: Emergency Medicine | Admitting: Emergency Medicine

## 2013-07-11 DIAGNOSIS — L0231 Cutaneous abscess of buttock: Secondary | ICD-10-CM | POA: Insufficient documentation

## 2013-07-11 DIAGNOSIS — F17200 Nicotine dependence, unspecified, uncomplicated: Secondary | ICD-10-CM | POA: Insufficient documentation

## 2013-07-11 DIAGNOSIS — L03317 Cellulitis of buttock: Principal | ICD-10-CM

## 2013-07-11 DIAGNOSIS — Z792 Long term (current) use of antibiotics: Secondary | ICD-10-CM | POA: Insufficient documentation

## 2013-07-11 MED ORDER — AMOXICILLIN-POT CLAVULANATE 875-125 MG PO TABS: 1.0000 | ORAL_TABLET | Freq: Two times a day (BID) | ORAL | Status: DC

## 2013-07-11 MED ORDER — METRONIDAZOLE 500 MG PO TABS: 500.0000 mg | ORAL_TABLET | Freq: Three times a day (TID) | ORAL | Status: DC

## 2013-07-11 MED ORDER — HYDROCODONE-ACETAMINOPHEN 5-325 MG PO TABS: 1.0000 | ORAL_TABLET | ORAL | Status: DC | PRN

## 2013-07-11 MED ORDER — SULFAMETHOXAZOLE-TRIMETHOPRIM 800-160 MG PO TABS: 1.0000 | ORAL_TABLET | Freq: Two times a day (BID) | ORAL | Status: DC

## 2013-07-11 NOTE — ED Provider Notes (Signed)
CSN: [id number]     Arrival date & time 07/11/13  1901 History   First MD Initiated Contact with Patient 07/11/13 2058     Chief Complaint  Patient presents with  . Abscess   (Consider location/radiation/quality/duration/timing/severity/associated sxs/prior Treatment) HPI Comments: Edward Krause is a 28 y.o. year-old male with a past medical history of abscess, presenting the Emergency Department with a chief complaint of abscess to left buttocks for 2 weeks.The patient reports worsening pain over 2 weeks.  He reports redness to the area for 1 week.  He reports warm compresses to the area without relief.  Denies drainage, fever or chills.  Patient is a 28 y.o. male presenting with abscess. The history is provided by the patient and medical records. No language interpreter was used.  Abscess Location:  Ano-genital Ano-genital abscess location:  L buttock Abscess quality: painful, redness and warmth   Red streaking: no   Duration:  2 weeks Progression:  Worsening Associated symptoms: no fever, no nausea and no vomiting     History reviewed. No pertinent past medical history. History reviewed. No pertinent past surgical history. No family history on file. History  Substance Use Topics  . Smoking status: Current Every Day Smoker  . Smokeless tobacco: Not on file  . Alcohol Use: No     Comment: occ    Review of Systems  Constitutional: Negative for fever and chills.  Gastrointestinal: Negative for nausea, vomiting, abdominal pain, diarrhea and constipation.    Allergies  Poison sumac extract  Home Medications   Current Outpatient Rx  Name  Route  Sig  Dispense  Refill  . amoxicillin-clavulanate (AUGMENTIN) 875-125 MG per tablet   Oral   Take 1 tablet by mouth 2 (two) times daily. Take with food.   14 tablet   0   . cephALEXin (KEFLEX) 500 MG capsule   Oral   Take 1 capsule (500 mg total) by mouth 4 (four) times daily.   28 capsule   0   . HYDROcodone-acetaminophen  (NORCO/VICODIN) 5-325 MG per tablet      Take one-two tabs po q 4-6 hrs prn pain   10 tablet   0   . HYDROcodone-acetaminophen (NORCO/VICODIN) 5-325 MG per tablet   Oral   Take 1 tablet by mouth every 4 (four) hours as needed. With meals   12 tablet   0   . metroNIDAZOLE (FLAGYL) 500 MG tablet   Oral   Take 1 tablet (500 mg total) by mouth 3 (three) times daily. Take with meals   21 tablet   0   . sulfamethoxazole-trimethoprim (SEPTRA DS) 800-160 MG per tablet   Oral   Take 1 tablet by mouth 2 (two) times daily.   20 tablet   0   . sulfamethoxazole-trimethoprim (SEPTRA DS) 800-160 MG per tablet   Oral   Take 1 tablet by mouth 2 (two) times daily.   14 tablet   0    BP 114/58  Pulse 84  Temp(Src) 98.8 F (37.1 C) (Oral)  Resp 24  Ht 5\' 9"  (1.753 m)  Wt 120 lb (54.432 kg)  BMI 17.71 kg/m2  SpO2 99% Physical Exam  Nursing note and vitals reviewed. Constitutional: He is oriented to person, place, and time. He appears well-developed and well-nourished. No distress.  HENT:  Head: Normocephalic and atraumatic.  Neck: Neck supple.  Pulmonary/Chest: Effort normal.  Abdominal: Soft. There is no tenderness.  Neurological: He is alert and oriented to person, place, and time.  Skin: Skin is warm and dry.     Abscess to left buttock. Small fluctuance center with a  3x3 cm area of induration with overlyting erythema.     ED Course  INCISION AND DRAINAGE Date/Time: 07/11/2013 10:03 PM Performed by: Clabe SealPARKER, Kiana Hollar M Authorized by: Clabe SealPARKER, Fernando Torry M Consent: Verbal consent obtained. Risks and benefits: risks, benefits and alternatives were discussed Consent given by: patient Patient understanding: patient states understanding of the procedure being performed Patient consent: the patient's understanding of the procedure matches consent given Procedure consent: procedure consent matches procedure scheduled Required items: required blood products, implants, devices, and  special equipment available Patient identity confirmed: verbally with patient Time out: Immediately prior to procedure a "time out" was called to verify the correct patient, procedure, equipment, support staff and site/side marked as required. Type: abscess Body area: lower extremity Location details: left buttock Anesthesia: local infiltration Local anesthetic: lidocaine 1% with epinephrine Anesthetic total: 7 ml Patient sedated: no Scalpel size: 15 Needle gauge: 22 Incision type: single straight Complexity: complex Drainage: purulent and bloody Drainage amount: scant Wound treatment: drain placed (Irrigated with sterile saline) Packing material: 1/4 in iodoform gauze Patient tolerance: Patient tolerated the procedure well with no immediate complications.   (including critical care time) Labs Review Labs Reviewed - No data to display Imaging Review No results found.  EKG Interpretation   None       MDM   1. Abscess of buttock, left    Pt with a history of abscess, present with abscess to left buttock.  I&D performed, with minimal purulent discharge, packing placed, patient tolerated well. Given the location will give antibiotics. Discussed treatment plan with the patient. Return precautions given. Reports understanding and no other concerns at this time.  Patient is stable for discharge at this time.   Meds given in ED:  Medications - No data to display  Discharge Medication List as of 07/11/2013 10:14 PM    START taking these medications   Details  amoxicillin-clavulanate (AUGMENTIN) 875-125 MG per tablet Take 1 tablet by mouth 2 (two) times daily. Take with food., Starting 07/11/2013, Until Discontinued, Print    !! HYDROcodone-acetaminophen (NORCO/VICODIN) 5-325 MG per tablet Take 1 tablet by mouth every 4 (four) hours as needed. With meals, Starting 07/11/2013, Until Discontinued, Print    metroNIDAZOLE (FLAGYL) 500 MG tablet Take 1 tablet (500 mg total) by mouth  3 (three) times daily. Take with meals, Starting 07/11/2013, Until Discontinued, Print    !! sulfamethoxazole-trimethoprim (SEPTRA DS) 800-160 MG per tablet Take 1 tablet by mouth 2 (two) times daily., Starting 07/11/2013, Until Discontinued, Print     !! - Potential duplicate medications found. Please discuss with provider.        Clabe SealLauren M Jaisha Villacres, PA-C 07/13/13 [phone]

## 2013-07-11 NOTE — ED Notes (Signed)
Pt c/o a bump to the left buttocks x 2 weeks that has gotten progressively worse.

## 2013-07-11 NOTE — ED Notes (Signed)
Abscess to buttock for 2 weeks

## 2013-07-11 NOTE — Discharge Instructions (Signed)
Call for a follow up appointment with a Family or Primary Care Provider.  Return in 2-3 days for a wound check. Change the dressing twice a day, or if it becomes damp. Don't start Sitz baths until the packing has been removed. Return if Symptoms worsen.   Take medication as prescribed.

## 2013-07-14 NOTE — ED Provider Notes (Signed)
Medical screening examination/treatment/procedure(s) were performed by non-physician practitioner and as supervising physician I was immediately available for consultation/collaboration.  EKG Interpretation   None         Markeith Jue L Chayla Shands, MD 07/14/13 1112 

## 2014-10-05 ENCOUNTER — Emergency Department (HOSPITAL_COMMUNITY)
Admission: EM | Admit: 2014-10-05 | Discharge: 2014-10-05 | Disposition: A | Discharge status: Home / Self Care | Payer: Self-pay | Attending: Emergency Medicine | Admitting: Emergency Medicine

## 2014-10-05 ENCOUNTER — Encounter (HOSPITAL_COMMUNITY): Payer: Self-pay | Admitting: Cardiology

## 2014-10-05 DIAGNOSIS — S0501XA Injury of conjunctiva and corneal abrasion without foreign body, right eye, initial encounter: Secondary | ICD-10-CM | POA: Insufficient documentation

## 2014-10-05 DIAGNOSIS — Y9389 Activity, other specified: Secondary | ICD-10-CM | POA: Insufficient documentation

## 2014-10-05 DIAGNOSIS — Y9289 Other specified places as the place of occurrence of the external cause: Secondary | ICD-10-CM | POA: Insufficient documentation

## 2014-10-05 DIAGNOSIS — Z720 Tobacco use: Secondary | ICD-10-CM | POA: Insufficient documentation

## 2014-10-05 DIAGNOSIS — X58XXXA Exposure to other specified factors, initial encounter: Secondary | ICD-10-CM | POA: Insufficient documentation

## 2014-10-05 DIAGNOSIS — T1591XA Foreign body on external eye, part unspecified, right eye, initial encounter: Secondary | ICD-10-CM | POA: Insufficient documentation

## 2014-10-05 DIAGNOSIS — Y998 Other external cause status: Secondary | ICD-10-CM | POA: Insufficient documentation

## 2014-10-05 MED ORDER — KETOROLAC TROMETHAMINE 0.5 % OP SOLN
1.0000 [drp] | Freq: Once | OPHTHALMIC | Status: AC
  Administered 2014-10-05: 1 [drp] via OPHTHALMIC
  Filled 2014-10-05: qty 5

## 2014-10-05 MED ORDER — TOBRAMYCIN 0.3 % OP SOLN
1.0000 [drp] | Freq: Once | OPHTHALMIC | Status: AC
  Administered 2014-10-05: 1 [drp] via OPHTHALMIC
  Filled 2014-10-05: qty 5

## 2014-10-05 MED ORDER — FLUORESCEIN SODIUM 1 MG OP STRP
ORAL_STRIP | OPHTHALMIC | Status: AC
  Administered 2014-10-05: 19:00:00
  Filled 2014-10-05: qty 1

## 2014-10-05 MED ORDER — FLUORESCEIN SODIUM 1 MG OP STRP
1.0000 | ORAL_STRIP | Freq: Once | OPHTHALMIC | Status: AC
  Administered 2014-10-05: 1 via OPHTHALMIC
  Filled 2014-10-05: qty 1

## 2014-10-05 MED ORDER — TETRACAINE HCL 0.5 % OP SOLN
2.0000 [drp] | Freq: Once | OPHTHALMIC | Status: AC
  Administered 2014-10-05: 2 [drp] via OPHTHALMIC
  Filled 2014-10-05: qty 2

## 2014-10-05 NOTE — ED Notes (Signed)
Thinks he has a piece of metal in his left eye.  Working with a grinder last night with no goggles.

## 2014-10-05 NOTE — ED Provider Notes (Signed)
CSN: [id number]     Arrival date & time 10/05/14  1556 History   First MD Initiated Contact with Patient 10/05/14 1804     Chief Complaint  Patient presents with  . Foreign Body in Eye     (Consider location/radiation/quality/duration/timing/severity/associated sxs/prior Treatment) HPI  Edward Krause is a 29 y.o. male who presents to the Emergency Department complaining of foreign body to his right eye. He was working on his car, using a grinder when he felt a piece of metal strike his eye.  He tried flushing his eye and using OTC drops without relief. He was not wearing any eye protection at the time and does not wear contacts.  He reports increased pain with blinking, bright lights and excessive tearing.  Vision is blurred in the affected eye.  He denies swelling, headache, facial pain or symptoms to the left eye.      History reviewed. No pertinent past medical history. History reviewed. No pertinent past surgical history. History reviewed. No pertinent family history. History  Substance Use Topics  . Smoking status: Current Every Day Smoker  . Smokeless tobacco: Not on file  . Alcohol Use: No     Comment: occ    Review of Systems  Constitutional: Negative for fever and chills.  HENT: Negative for congestion and facial swelling.   Eyes: Positive for photophobia, pain, redness and visual disturbance. Negative for itching.  Gastrointestinal: Negative for nausea and vomiting.  Skin: Negative for wound.  Neurological: Negative for dizziness, syncope, speech difficulty, numbness and headaches.  All other systems reviewed and are negative.     Allergies  Poison sumac extract  Home Medications   Prior to Admission medications   Medication Sig Start Date End Date Taking? Authorizing Provider  amoxicillin-clavulanate (AUGMENTIN) 875-125 MG per tablet Take 1 tablet by mouth 2 (two) times daily. Take with food. Patient not taking: Reported on 10/05/2014 07/11/13   Mellody Drown,  PA-C  cephALEXin (KEFLEX) 500 MG capsule Take 1 capsule (500 mg total) by mouth 4 (four) times daily. Patient not taking: Reported on 10/05/2014 03/27/13   Emmerich Cryer, PA-C  HYDROcodone-acetaminophen (NORCO/VICODIN) 5-325 MG per tablet Take one-two tabs po q 4-6 hrs prn pain Patient not taking: Reported on 10/05/2014 03/27/13   Myrtle Haller, PA-C  HYDROcodone-acetaminophen (NORCO/VICODIN) 5-325 MG per tablet Take 1 tablet by mouth every 4 (four) hours as needed. With meals Patient not taking: Reported on 10/05/2014 07/11/13   Mellody Drown, PA-C  metroNIDAZOLE (FLAGYL) 500 MG tablet Take 1 tablet (500 mg total) by mouth 3 (three) times daily. Take with meals Patient not taking: Reported on 10/05/2014 07/11/13   Mellody Drown, PA-C  sulfamethoxazole-trimethoprim (SEPTRA DS) 800-160 MG per tablet Take 1 tablet by mouth 2 (two) times daily. Patient not taking: Reported on 10/05/2014 07/11/13   Mellody Drown, PA-C   BP 127/67 mmHg  Pulse 107  Temp(Src) 98.1 F (36.7 C) (Oral)  Resp 20  Ht  (1.753 m)  Wt 135 lb (61.236 kg)  BMI 19.93 kg/m2  SpO2 97% Physical Exam  Constitutional: He is oriented to person, place, and time. He appears well-developed and well-nourished. No distress.  HENT:  Head: Normocephalic and atraumatic.  Eyes: EOM are normal. Pupils are equal, round, and reactive to light. Lids are everted and swept, no foreign bodies found. Right conjunctiva is not injected. Left conjunctiva is not injected.  Fundoscopic exam:      The right eye shows no papilledema.  Slit lamp exam:  The right eye shows corneal abrasion, foreign body and fluorescein uptake. The right eye shows no corneal ulcer and no anterior chamber bulge.    Metallic FB present at 9:00 position with large surrounding corneal abrasion.  Slit lamp exam reveals negative Seidel's sign, no hyphema   Neck: Normal range of motion. Neck supple. No thyromegaly present.  Cardiovascular: Normal rate, regular rhythm,  normal heart sounds and intact distal pulses.   No murmur heard. Pulmonary/Chest: Effort normal and breath sounds normal. No respiratory distress.  Musculoskeletal: Normal range of motion.  Lymphadenopathy:    He has no cervical adenopathy.  Neurological: He is alert and oriented to person, place, and time. He exhibits normal muscle tone. Coordination normal.  Skin: Skin is warm and dry. No rash noted.  Nursing note and vitals reviewed.   ED Course  Procedures (including critical care time) Labs Review Labs Reviewed - No data to display  Imaging Review No results found.   EKG Interpretation None         Visual Acuity  Right Eye Distance: 50 Left Eye Distance: 25 Bilateral Distance: 25  Right Eye Near: R Near: 20 Left Eye Near:  L Near: 20 Bilateral Near:  20   MDM   Final diagnoses:  Foreign body, eye, right, initial encounter  Corneal abrasion, right, initial encounter    Metallic foreign body was removed by me using a cotton swab tip.  Small rust ring remains.    1915 Consulted Dr. Lita MainsHaines will see patient in his office on Friday 4/22   Pt is feeling better and stable for d/c.  Dispensed tobramycin and ketorolac   Pauline Ausammy Sachiko Methot, PA-C 10/07/14 1917  Glynn OctaveStephen Rancour, MD 10/08/14 1009

## 2014-10-05 NOTE — Discharge Instructions (Signed)
Corneal Abrasion °The cornea is the clear covering at the front and center of the eye. When looking at the colored portion of the eye (iris), you are looking through the cornea. This very thin tissue is made up of many layers. The surface layer is a single layer of cells (corneal epithelium) and is one of the most sensitive tissues in the body. If a scratch or injury causes the corneal epithelium to come off, it is called a corneal abrasion. If the injury extends to the tissues below the epithelium, the condition is called a corneal ulcer. °CAUSES  °· Scratches. °· Trauma. °· Foreign body in the eye. °Some people have recurrences of abrasions in the area of the original injury even after it has healed (recurrent erosion syndrome). Recurrent erosion syndrome generally improves and goes away with time. °SYMPTOMS  °· Eye pain. °· Difficulty or inability to keep the injured eye open. °· The eye becomes very sensitive to light. °· Recurrent erosions tend to happen suddenly, first thing in the morning, usually after waking up and opening the eye. °DIAGNOSIS  °Your health care provider can diagnose a corneal abrasion during an eye exam. Dye is usually placed in the eye using a drop or a small paper strip moistened by your tears. When the eye is examined with a special light, the abrasion shows up clearly because of the dye. °TREATMENT  °· Small abrasions may be treated with antibiotic drops or ointment alone. °· A pressure patch may be put over the eye. If this is done, follow your doctor's instructions for when to remove the patch. Do not drive or use machines while the eye patch is on. Judging distances is hard to do with a patch on. °If the abrasion becomes infected and spreads to the deeper tissues of the cornea, a corneal ulcer can result. This is serious because it can cause corneal scarring. Corneal scars interfere with light passing through the cornea and cause a loss of vision in the involved eye. °HOME CARE  INSTRUCTIONS °· Use medicine or ointment as directed. Only take over-the-counter or prescription medicines for pain, discomfort, or fever as directed by your health care provider. °· Do not drive or operate machinery if your eye is patched. Your ability to judge distances is impaired. °· If your health care provider has given you a follow-up appointment, it is very important to keep that appointment. Not keeping the appointment could result in a severe eye infection or permanent loss of vision. If there is any problem keeping the appointment, let your health care provider know. °SEEK MEDICAL CARE IF:  °· You have pain, light sensitivity, and a scratchy feeling in one eye or both eyes. °· Your pressure patch keeps loosening up, and you can blink your eye under the patch after treatment. °· Any kind of discharge develops from the eye after treatment or if the lids stick together in the morning. °· You have the same symptoms in the morning as you did with the original abrasion days, weeks, or months after the abrasion healed. °MAKE SURE YOU:  °· Understand these instructions. °· Will watch your condition. °· Will get help right away if you are not doing well or get worse. °Document Released: 05/30/2000 Document Revised: 06/07/2013 Document Reviewed: 02/07/2013 °ExitCare® Patient Information ©2015 [employer]. This information is not intended to replace advice given to you by your health care provider. Make sure you discuss any questions you have with your health care provider. ° °Eye, Foreign   Body A foreign body is an object that should not be there. The object could be near, on, or in the eye. HOME CARE If your doctor prescribes an eye patch:  Keep the eye patch on. Do this until you see your doctor again.  Do not remove the patch to put in medicine unless your doctor tells you.  Retape it as it was before:  When replacing the patch.  If the patch comes loose.  Do not drive or use machinery.  Only  take medicine as told by your doctor. If your doctor does not prescribe an eye patch:  Keep the eye closed as much as possible.  Do not rub the eye.  Wear dark glasses in bright light.  Do not wear contact lenses until the eye feels normal, or as told by your doctor.  Wear protective eye covering, especially when using high speed tools.  Only take medicine as told by your doctor. GET HELP RIGHT AWAY IF:   Your pain gets worse.  Your vision changes.  You have problems with the eye patch.  The injury gets larger.  There is fluid (discharge) coming from the eye.  You get puffiness (swelling) and soreness.  You have an oral temperature above 102 F (38.9 C), not controlled by medicine.  Your baby is older than 3 months with a rectal temperature of 102 F (38.9 C) or higher.  Your baby is 673 months old or younger with a rectal temperature of 100.4 F (38 C) or higher. MAKE SURE YOU:   Understand these instructions.  Will watch your condition.  Will get help right away if you are not doing well or get worse. Document Released: 11/20/2009 Document Revised: 08/25/2011 Document Reviewed: 10/28/2012 [facility] Patient Information 2015 [place], [place]. This information is not intended to replace advice given to you by your health care provider. Make sure you discuss any questions you have with your health care provider.

## 2015-01-08 ENCOUNTER — Emergency Department (HOSPITAL_COMMUNITY)
Admission: EM | Admit: 2015-01-08 | Discharge: 2015-01-09 | Disposition: A | Discharge status: Home / Self Care | Payer: Self-pay | Attending: Emergency Medicine | Admitting: Emergency Medicine

## 2015-01-08 ENCOUNTER — Emergency Department (HOSPITAL_COMMUNITY): Payer: No Typology Code available for payment source

## 2015-01-08 ENCOUNTER — Emergency Department (HOSPITAL_COMMUNITY): Payer: Self-pay

## 2015-01-08 ENCOUNTER — Encounter (HOSPITAL_COMMUNITY): Payer: Self-pay | Admitting: *Deleted

## 2015-01-08 DIAGNOSIS — F17200 Nicotine dependence, unspecified, uncomplicated: Secondary | ICD-10-CM | POA: Insufficient documentation

## 2015-01-08 DIAGNOSIS — S20212A Contusion of left front wall of thorax, initial encounter: Secondary | ICD-10-CM

## 2015-01-08 DIAGNOSIS — Y9389 Activity, other specified: Secondary | ICD-10-CM | POA: Insufficient documentation

## 2015-01-08 DIAGNOSIS — Y92410 Unspecified street and highway as the place of occurrence of the external cause: Secondary | ICD-10-CM | POA: Insufficient documentation

## 2015-01-08 DIAGNOSIS — Y999 Unspecified external cause status: Secondary | ICD-10-CM | POA: Insufficient documentation

## 2015-01-08 DIAGNOSIS — S0292XA Unspecified fracture of facial bones, initial encounter for closed fracture: Secondary | ICD-10-CM

## 2015-01-08 DIAGNOSIS — S022XXA Fracture of nasal bones, initial encounter for closed fracture: Secondary | ICD-10-CM | POA: Insufficient documentation

## 2015-01-08 LAB — I-STAT CHEM 8, ED
BUN: 5 mg/dL — ABNORMAL LOW (ref 6–20)
Calcium, Ion: 1.22 mmol/L (ref 1.12–1.23)
Chloride: 101 mmol/L (ref 101–111)
Creatinine, Ser: 0.9 mg/dL (ref 0.61–1.24)
GLUCOSE: 93 mg/dL (ref 65–99)
HCT: 45 % (ref 39.0–52.0)
HEMOGLOBIN: 15.3 g/dL (ref 13.0–17.0)
POTASSIUM: 3.3 mmol/L — AB (ref 3.5–5.1)
SODIUM: 141 mmol/L (ref 135–145)
TCO2: 24 mmol/L (ref 0–100)

## 2015-01-08 MED ORDER — TETANUS-DIPHTH-ACELL PERTUSSIS 5-2.5-18.5 LF-MCG/0.5 IM SUSP
0.5000 mL | Freq: Once | INTRAMUSCULAR | Status: AC
  Administered 2015-01-08: 0.5 mL via INTRAMUSCULAR
  Filled 2015-01-08: qty 0.5
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
## 2015-01-09 ENCOUNTER — Encounter (HOSPITAL_COMMUNITY): Payer: Self-pay

## 2015-01-09 ENCOUNTER — Emergency Department (HOSPITAL_COMMUNITY): Payer: Self-pay

## 2015-01-09 MED ORDER — METHOCARBAMOL 500 MG PO TABS
500.0000 mg | ORAL_TABLET | Freq: Three times a day (TID) | ORAL | Status: DC | PRN
Start: 2015-01-09 — End: 2016-09-19

## 2015-01-09 MED ORDER — IOHEXOL 300 MG/ML  SOLN
80.0000 mL | Freq: Once | INTRAMUSCULAR | Status: AC | PRN
  Administered 2015-01-09: 100 mL via INTRAVENOUS

## 2015-01-09 MED ORDER — IBUPROFEN 800 MG PO TABS: 800.0000 mg | ORAL_TABLET | Freq: Three times a day (TID) | ORAL | Status: DC | PRN
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
## 2015-01-09 NOTE — ED Notes (Signed)
Patient transported to CT 

## 2015-01-09 NOTE — Discharge Instructions (Signed)

## 2015-02-28 ENCOUNTER — Emergency Department (HOSPITAL_COMMUNITY)
Admission: EM | Admit: 2015-02-28 | Discharge: 2015-02-28 | Disposition: A | Discharge status: Home / Self Care | Payer: Self-pay | Attending: Emergency Medicine | Admitting: Emergency Medicine

## 2015-02-28 ENCOUNTER — Encounter (HOSPITAL_COMMUNITY): Payer: Self-pay | Admitting: Emergency Medicine

## 2015-02-28 DIAGNOSIS — L0201 Cutaneous abscess of face: Secondary | ICD-10-CM | POA: Insufficient documentation

## 2015-02-28 DIAGNOSIS — Z720 Tobacco use: Secondary | ICD-10-CM | POA: Insufficient documentation

## 2015-02-28 MED ORDER — LIDOCAINE-EPINEPHRINE (PF) 2 %-1:200000 IJ SOLN
10.0000 mL | Freq: Once | INTRAMUSCULAR | Status: AC
  Administered 2015-02-28: 10 mL
  Filled 2015-02-28: qty 20

## 2015-02-28 MED ORDER — HYDROCODONE-ACETAMINOPHEN 5-325 MG PO TABS: 1.0000 | ORAL_TABLET | ORAL | Status: DC | PRN

## 2015-02-28 NOTE — ED Notes (Signed)
Pt states that he has a bump on the left side of his face that will not go away.

## 2015-02-28 NOTE — Discharge Instructions (Signed)

## 2015-02-28 NOTE — ED Notes (Signed)
Reports of red bump to left side of face x1 month. States he has tried to pop bump but not able to get anything out. States he works at [employer] and worried he is going to get something in bump. No drainage noted to bump.

## 2015-03-02 NOTE — ED Provider Notes (Signed)
CSN: [id number]     Arrival date & time 02/28/15  1419 History   First MD Initiated Contact with Patient 02/28/15 1430     Chief Complaint  Patient presents with  . Acne     (Consider location/radiation/quality/duration/timing/severity/associated sxs/prior Treatment) The history is provided by the patient and the spouse.   Edward Krause is a 29 y.o. male with a history of occasional facial sebaceous cysts, one requiring I&D several years ago presenting with a one-month history of a similar lesion on his left cheek.  It has become increasingly red, tender and he has attempted to express contents with no improvement.  He denies fevers or chills, he has applied warm compresses without relief.     History reviewed. No pertinent past medical history. History reviewed. No pertinent past surgical history. History reviewed. No pertinent family history. Social History  Substance Use Topics  . Smoking status: Current Every Day Smoker    Types: Cigarettes  . Smokeless tobacco: None  . Alcohol Use: No     Comment: occ    Review of Systems  Constitutional: Negative for fever and chills.  Respiratory: Negative for shortness of breath and wheezing.   Skin: Positive for color change and wound.  Neurological: Negative for numbness.      Allergies  Poison sumac extract  Home Medications   Prior to Admission medications   Medication Sig Start Date End Date Taking? Authorizing Provider  HYDROcodone-acetaminophen (NORCO/VICODIN) 5-325 MG per tablet Take 1 tablet by mouth every 4 (four) hours as needed. 02/28/15   Burgess Amor, PA-C  ibuprofen (ADVIL,MOTRIN) 800 MG tablet Take 1 tablet (800 mg total) by mouth every 8 (eight) hours as needed for moderate pain. Patient not taking: Reported on 02/28/2015 01/09/15   Marisa Severin, MD  methocarbamol (ROBAXIN) 500 MG tablet Take 1 tablet (500 mg total) by mouth every 8 (eight) hours as needed for muscle spasms. Patient not taking: Reported on 02/28/2015  01/09/15   Marisa Severin, MD   BP 110/80 mmHg  Pulse 87  Temp(Src) 97.5 F (36.4 C) (Oral)  Resp 18  Ht  (1.753 m)  Wt 120 lb (54.432 kg)  BMI 17.71 kg/m2  SpO2 100% Physical Exam  Constitutional: He is oriented to person, place, and time. He appears well-developed and well-nourished.  HENT:  Head: Normocephalic.  Cardiovascular: Normal rate.   Pulmonary/Chest: Effort normal.  Neurological: He is alert and oriented to person, place, and time. No sensory deficit.  Skin: Lesion noted.  1.5 cm red induration left maxilla.  There are 2 dark and enlarged pores within this area suggesting this is a sebaceous cyst.  There is no fluctuance.  There is no red streaking or surrounding erythema.    ED Course  Procedures (including critical care time)  INCISION AND DRAINAGE Performed by: Burgess Amor Consent: Verbal consent obtained. Risks and benefits: risks, benefits and alternatives were discussed Type: abscess  Body area: left cheek  Anesthesia: local infiltration  Incision was made with a scalpel.  Local anesthetic: lidocaine 2% with epinephrine  Anesthetic total: 0.5 ml  Complexity: complex Blunt dissection to break up loculations  Drainage: purulent, no sebum, no identifiable core   Drainage amount: small  Packing material: none  Patient tolerance: Patient tolerated the procedure well with no immediate complications.    Labs Review Labs Reviewed - No data to display  Imaging Review No results found. I have personally reviewed and evaluated these images and lab results as part of my  medical decision-making.   EKG Interpretation None      MDM   Final diagnoses:  Abscess of face    Pt advised warm compresses, avoid squeezing the site.  Hydrocodone prescribed for pain.  Return here or follow up with his PCP for any worsened symptoms.    Burgess Amor, PA-C 03/02/15 1731  Bethann Berkshire, MD 03/06/15 [phone]

## 2016-04-27 ENCOUNTER — Encounter (HOSPITAL_COMMUNITY): Payer: Self-pay

## 2016-04-27 ENCOUNTER — Emergency Department (HOSPITAL_COMMUNITY)
Admission: EM | Admit: 2016-04-27 | Discharge: 2016-04-27 | Disposition: A | Discharge status: Home / Self Care | Payer: Self-pay | Attending: Emergency Medicine | Admitting: Emergency Medicine

## 2016-04-27 DIAGNOSIS — M5441 Lumbago with sciatica, right side: Secondary | ICD-10-CM | POA: Insufficient documentation

## 2016-04-27 DIAGNOSIS — M542 Cervicalgia: Secondary | ICD-10-CM | POA: Insufficient documentation

## 2016-04-27 DIAGNOSIS — M5431 Sciatica, right side: Secondary | ICD-10-CM

## 2016-04-27 DIAGNOSIS — F17210 Nicotine dependence, cigarettes, uncomplicated: Secondary | ICD-10-CM | POA: Insufficient documentation

## 2016-04-27 HISTORY — DX: Dorsalgia, unspecified: M54.9

## 2016-04-27 HISTORY — DX: Unspecified injury of lower back, initial encounter: S39.92XA

## 2016-04-27 MED ORDER — HYDROCODONE-ACETAMINOPHEN 5-325 MG PO TABS
1.0000 | ORAL_TABLET | Freq: Once | ORAL | Status: AC
  Administered 2016-04-27: 1 via ORAL
  Filled 2016-04-27: qty 1

## 2016-04-27 MED ORDER — IBUPROFEN 800 MG PO TABS
800.0000 mg | ORAL_TABLET | Freq: Once | ORAL | Status: AC
  Administered 2016-04-27: 800 mg via ORAL
  Filled 2016-04-27: qty 1

## 2016-04-27 MED ORDER — IBUPROFEN 600 MG PO TABS: 600.0000 mg | ORAL_TABLET | Freq: Four times a day (QID) | ORAL | 0 refills | Status: DC | PRN

## 2016-04-27 MED ORDER — CYCLOBENZAPRINE HCL 5 MG PO TABS: 5.0000 mg | ORAL_TABLET | Freq: Three times a day (TID) | ORAL | 0 refills | Status: DC | PRN

## 2016-04-27 MED ORDER — HYDROCODONE-ACETAMINOPHEN 5-325 MG PO TABS: 1.0000 | ORAL_TABLET | ORAL | 0 refills | Status: DC | PRN

## 2016-04-27 MED ORDER — CYCLOBENZAPRINE HCL 10 MG PO TABS
10.0000 mg | ORAL_TABLET | Freq: Once | ORAL | Status: AC
  Administered 2016-04-27: 10 mg via ORAL
  Filled 2016-04-27: qty 1
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
## 2016-04-27 NOTE — ED Provider Notes (Signed)
[facility]EM[facility] DEPT Provider Note   CSN: [id number] Arrival date & time: 04/27/16  1918  By signing my name below, I, Rosario AdieWilliam Andrew Krause, attest that this documentation has been prepared under the direction and in the presence of Burgess AmorJulie Tehillah Cipriani, PA-C.  Electronically Signed: Rosario AdieWilliam Andrew Krause, ED Scribe. 04/27/16. 7:58 PM.  History   Chief Complaint Chief Complaint  Patient presents with  . Back Pain   The history is provided by the patient. No language interpreter was used.    HPI Comments: Edward Krause is a 30 y.o. male with a PMHx of chronic back pain, who presents to the Emergency Department complaining of acute on chronic, intermittent lower back pain onset one year ago. He states that his pain will intermittently radiate from his back down into his lateral portion of his right leg and upwards into his neck onset which began approximately 2 weeks ago, and states that his pain will typically last for ~1.5 days each time. Pt further notes that his neck has been stiff and he has been unable to fully turn his head in either direction for the past two days. He describes his pain as sharp and states that his right leg will also become occasionally numb secondary to his episodes of radiating pain. His pain will mildly alleviate with sitting still and resting; however, he has also been taking Tylenol and applying with minimal relief. His pain is exacerbated with generalized movements and palpation of the back.  No recent new injury or trauma to the back otherwise. Pt does not currently work. He is a current, everyday smoker (0.5ppd). Denies alcohol or illicit/IVDA otherwise. Denies bowel/bladder incontinence, fever, or any other associated symptoms.  PCP: Colette RibasGOLDING, JOHN CABOT, MD   Past Medical History:  Diagnosis Date  . Back injury    July 2016  . Back pain    There are no active problems to display for this patient.  History reviewed. No pertinent surgical history.  Home  Medications    Prior to Admission medications   Medication Sig Start Date End Date Taking? Authorizing Provider  cyclobenzaprine (FLEXERIL) 5 MG tablet Take 1 tablet (5 mg total) by mouth 3 (three) times daily as needed for muscle spasms. 04/27/16   Burgess AmorJulie Darleene Cumpian, PA-C  HYDROcodone-acetaminophen (NORCO/VICODIN) 5-325 MG tablet Take 1 tablet by mouth every 4 (four) hours as needed. 04/27/16   Burgess AmorJulie Graden Hoshino, PA-C  ibuprofen (ADVIL,MOTRIN) 600 MG tablet Take 1 tablet (600 mg total) by mouth every 6 (six) hours as needed. 04/27/16   Burgess AmorJulie Rapheal Masso, PA-C  methocarbamol (ROBAXIN) 500 MG tablet Take 1 tablet (500 mg total) by mouth every 8 (eight) hours as needed for muscle spasms. Patient not taking: Reported on 02/28/2015 01/09/15   Marisa Severinlga Otter, MD   Family History No family history on file.  Social History Social History  Substance Use Topics  . Smoking status: Current Every Day Smoker    Packs/day: 0.50    Years: 10.00    Types: Cigarettes  . Smokeless tobacco: Never Used  . Alcohol use No     Comment: occ   Allergies   Poison sumac extract   Review of Systems Review of Systems  Constitutional: Negative for fever.  Musculoskeletal: Positive for arthralgias, back pain, myalgias, neck pain and neck stiffness.  Neurological: Positive for numbness.       Negative for bowel/bladder incontinence.   All other systems reviewed and are negative.  Physical Exam Updated Vital Signs BP 112/69   Pulse 93  Temp 98.2 F (36.8 C)   Resp 16   Ht 5\' 9"  (1.753 m)   Wt 61.2 kg   SpO2 99%   BMI 19.94 kg/m   Physical Exam  Constitutional: He appears well-developed and well-nourished. No distress.  HENT:  Head: Normocephalic and atraumatic.  Eyes: Conjunctivae are normal.  Neck: Normal range of motion. Neck supple.  Cardiovascular: Normal rate and intact distal pulses.   Pedal pulses normal.  Pulmonary/Chest: Effort normal.  Abdominal: Soft. Bowel sounds are normal. He exhibits no distension and  no mass.  Musculoskeletal: He exhibits no edema.       Cervical back: He exhibits decreased range of motion. He exhibits no bony tenderness, no edema, no deformity and no spasm.       Lumbar back: He exhibits tenderness and bony tenderness. He exhibits no swelling, no edema and no spasm.  Equal grip strength.  Neurological: He is alert. He has normal strength. He displays no atrophy and no tremor. No sensory deficit. Gait normal.  Reflex Scores:      Patellar reflexes are 2+ on the right side and 2+ on the left side.      Achilles reflexes are 2+ on the right side and 2+ on the left side. No strength deficit noted in hip and knee flexor and extensor muscle groups.  Ankle flexion and extension intact.  Skin: Skin is warm and dry. No pallor.  Psychiatric: He has a normal mood and affect. His behavior is normal.  Nursing note and vitals reviewed.  ED Treatments / Results  DIAGNOSTIC STUDIES: Oxygen Saturation is 99% on RA, normal by my interpretation.   COORDINATION OF CARE: 7:58 PM-Discussed next steps with pt. Pt verbalized understanding and is agreeable with the plan.   Labs (all labs ordered are listed, but only abnormal results are displayed) Labs Reviewed - No data to display  Radiology No results found.  Procedures Procedures   Medications Ordered in ED Medications  HYDROcodone-acetaminophen (NORCO/VICODIN) 5-325 MG per tablet 1 tablet (1 tablet Oral Given 04/27/16 2029)  cyclobenzaprine (FLEXERIL) tablet 10 mg (10 mg Oral Given 04/27/16 2029)  ibuprofen (ADVIL,MOTRIN) tablet 800 mg (800 mg Oral Given 04/27/16 2029)    Initial Impression / Assessment and Plan / ED Course  I have reviewed the triage vital signs and the nursing notes.  Pertinent labs & imaging results that were available during my care of the patient were reviewed by me and considered in my medical decision making (see chart for details).  Clinical Course    No neurological deficits appreciated.  Patient is ambulatory. No warning symptoms of back pain including: fecal incontinence, urinary retention or overflow incontinence, night sweats, waking from sleep with back pain, unexplained fevers or weight loss, h/o cancer, IVDU, recent trauma. No concern for cauda equina, epidural abscess, or other serious cause of back pain. Conservative measures including rest, ice/heat and pain medicine indicated with PCP follow-up if no improvement with conservative management.   Final Clinical Impressions(s) / ED Diagnoses   Final diagnoses:  Sciatica of right side   New Prescriptions Discharge Medication List as of 04/27/2016  8:27 PM    START taking these medications   Details  cyclobenzaprine (FLEXERIL) 5 MG tablet Take 1 tablet (5 mg total) by mouth 3 (three) times daily as needed for muscle spasms., Starting Sun 04/27/2016, Print       I personally performed the services described in this documentation, which was scribed in my presence. The recorded information has  been reviewed and is accurate.     Burgess AmorJulie Raihan Kimmel, PA-C 04/27/16 2043    Samuel JesterKathleen McManus, DO 04/27/16 2217

## 2016-04-27 NOTE — ED Notes (Signed)
Pt verbalized understanding of no driving and to use caution within 4 hours of taking pain meds due to meds cause drowsiness 

## 2016-04-27 NOTE — Discharge Instructions (Signed)
T  Do not drive within 4 hours of taking hydrocodonecodone as this will make you drowsy.  Avoid lifting,  Bending,  Twisting or any other activity that worsens your pain over the next week.  Apply a heating pad 20 minutes 3 times daily followed by stretching exercises as discussed.  You should get rechecked if your symptoms are not better over the next 5 days,  Or you develop increased pain,  Weakness in your leg(s) or loss of bladder or bowel function - these are symptoms of a worse injury.
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
## 2016-09-19 ENCOUNTER — Encounter (HOSPITAL_COMMUNITY): Payer: Self-pay

## 2016-09-19 ENCOUNTER — Emergency Department (HOSPITAL_COMMUNITY): Payer: No Typology Code available for payment source

## 2016-09-19 ENCOUNTER — Emergency Department (HOSPITAL_COMMUNITY)
Admission: EM | Admit: 2016-09-19 | Discharge: 2016-09-19 | Disposition: A | Discharge status: Home / Self Care | Payer: No Typology Code available for payment source | Attending: Emergency Medicine | Admitting: Emergency Medicine

## 2016-09-19 DIAGNOSIS — Y999 Unspecified external cause status: Secondary | ICD-10-CM | POA: Diagnosis not present

## 2016-09-19 DIAGNOSIS — M542 Cervicalgia: Secondary | ICD-10-CM | POA: Insufficient documentation

## 2016-09-19 DIAGNOSIS — S8011XA Contusion of right lower leg, initial encounter: Secondary | ICD-10-CM | POA: Insufficient documentation

## 2016-09-19 DIAGNOSIS — Y939 Activity, unspecified: Secondary | ICD-10-CM | POA: Insufficient documentation

## 2016-09-19 DIAGNOSIS — M545 Low back pain: Secondary | ICD-10-CM | POA: Insufficient documentation

## 2016-09-19 DIAGNOSIS — S3992XA Unspecified injury of lower back, initial encounter: Secondary | ICD-10-CM | POA: Diagnosis present

## 2016-09-19 DIAGNOSIS — Y92410 Unspecified street and highway as the place of occurrence of the external cause: Secondary | ICD-10-CM | POA: Insufficient documentation

## 2016-09-19 DIAGNOSIS — R079 Chest pain, unspecified: Secondary | ICD-10-CM | POA: Insufficient documentation

## 2016-09-19 DIAGNOSIS — F17210 Nicotine dependence, cigarettes, uncomplicated: Secondary | ICD-10-CM | POA: Diagnosis not present

## 2016-09-19 DIAGNOSIS — S8012XA Contusion of left lower leg, initial encounter: Secondary | ICD-10-CM | POA: Insufficient documentation

## 2016-09-19 MED ORDER — IBUPROFEN 400 MG PO TABS
600.0000 mg | ORAL_TABLET | Freq: Once | ORAL | Status: AC
  Administered 2016-09-19: 600 mg via ORAL
  Filled 2016-09-19: qty 2

## 2016-09-19 NOTE — Discharge Instructions (Signed)
Take tylenol or advil for pain . Wash the scrapes on your legs daily with soap and water, and then place a thin layer of bacitracin ointment over the wounds. Signs of infection include redness around the wounds, drainage from the wounds, more pain, swelling or fever. Return or see your doctor or an urgent care center if you thing that you might be developing and infection. Return if concerned for any reason.
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
## 2018-05-31 ENCOUNTER — Other Ambulatory Visit: Payer: Self-pay

## 2018-05-31 ENCOUNTER — Emergency Department (HOSPITAL_COMMUNITY)
Admission: EM | Admit: 2018-05-31 | Discharge: 2018-05-31 | Disposition: A | Discharge status: Home / Self Care | Payer: Self-pay | Attending: Emergency Medicine | Admitting: Emergency Medicine

## 2018-05-31 ENCOUNTER — Encounter (HOSPITAL_COMMUNITY): Payer: Self-pay | Admitting: *Deleted

## 2018-05-31 DIAGNOSIS — F17210 Nicotine dependence, cigarettes, uncomplicated: Secondary | ICD-10-CM | POA: Insufficient documentation

## 2018-05-31 DIAGNOSIS — B352 Tinea manuum: Secondary | ICD-10-CM | POA: Insufficient documentation
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
## 2018-05-31 NOTE — Discharge Instructions (Addendum)
Try to keep your hand dry. Use cortaid OTC on the rash and either mix with lamisil cream or use atheletes foot powder on the area.  If it is a fungal infection, it will take at least 3 weeks to totally resolve. Recheck if not improving in the next 2-3 weeks.

## 2018-05-31 NOTE — ED Triage Notes (Signed)
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

## 2018-07-03 ENCOUNTER — Encounter (HOSPITAL_COMMUNITY): Payer: Self-pay | Admitting: Emergency Medicine

## 2018-07-03 ENCOUNTER — Emergency Department (HOSPITAL_COMMUNITY): Payer: Self-pay

## 2018-07-03 ENCOUNTER — Other Ambulatory Visit: Payer: Self-pay

## 2018-07-03 ENCOUNTER — Emergency Department (HOSPITAL_COMMUNITY)
Admission: EM | Admit: 2018-07-03 | Discharge: 2018-07-03 | Disposition: A | Discharge status: Home / Self Care | Payer: Self-pay | Attending: Emergency Medicine | Admitting: Emergency Medicine

## 2018-07-03 DIAGNOSIS — J101 Influenza due to other identified influenza virus with other respiratory manifestations: Secondary | ICD-10-CM | POA: Insufficient documentation

## 2018-07-03 DIAGNOSIS — Z87891 Personal history of nicotine dependence: Secondary | ICD-10-CM | POA: Insufficient documentation

## 2018-07-03 LAB — COMPREHENSIVE METABOLIC PANEL
ALK PHOS: 60 U/L (ref 38–126)
ALT: 21 U/L (ref 0–44)
AST: 28 U/L (ref 15–41)
Albumin: 4.5 g/dL (ref 3.5–5.0)
Anion gap: 11 (ref 5–15)
BILIRUBIN TOTAL: 0.3 mg/dL (ref 0.3–1.2)
BUN: 14 mg/dL (ref 6–20)
CALCIUM: 9 mg/dL (ref 8.9–10.3)
CO2: 24 mmol/L (ref 22–32)
Chloride: 101 mmol/L (ref 98–111)
Creatinine, Ser: 0.98 mg/dL (ref 0.61–1.24)
Glucose, Bld: 129 mg/dL — ABNORMAL HIGH (ref 70–99)
POTASSIUM: 3.8 mmol/L (ref 3.5–5.1)
Sodium: 136 mmol/L (ref 135–145)
TOTAL PROTEIN: 7.8 g/dL (ref 6.5–8.1)

## 2018-07-03 LAB — URINALYSIS, ROUTINE W REFLEX MICROSCOPIC
Bilirubin Urine: NEGATIVE
GLUCOSE, UA: NEGATIVE mg/dL
Hgb urine dipstick: NEGATIVE
KETONES UR: NEGATIVE mg/dL
Leukocytes, UA: NEGATIVE
NITRITE: NEGATIVE
PROTEIN: NEGATIVE mg/dL
Specific Gravity, Urine: 1.028 (ref 1.005–1.030)
pH: 5 (ref 5.0–8.0)

## 2018-07-03 LAB — CBC WITH DIFFERENTIAL/PLATELET
Abs Immature Granulocytes: 0.01 10*3/uL (ref 0.00–0.07)
Basophils Absolute: 0.1 10*3/uL (ref 0.0–0.1)
Basophils Relative: 1 %
EOS ABS: 0.1 10*3/uL (ref 0.0–0.5)
EOS PCT: 1 %
HEMATOCRIT: 41.6 % (ref 39.0–52.0)
HEMOGLOBIN: 13.6 g/dL (ref 13.0–17.0)
Immature Granulocytes: 0 %
LYMPHS ABS: 0.4 10*3/uL — AB (ref 0.7–4.0)
Lymphocytes Relative: 8 %
MCH: 30.2 pg (ref 26.0–34.0)
MCHC: 32.7 g/dL (ref 30.0–36.0)
MCV: 92.4 fL (ref 80.0–100.0)
MONO ABS: 1.1 10*3/uL — AB (ref 0.1–1.0)
Monocytes Relative: 19 %
NRBC: 0 % (ref 0.0–0.2)
Neutro Abs: 4.2 10*3/uL (ref 1.7–7.7)
Neutrophils Relative %: 71 %
Platelets: 231 10*3/uL (ref 150–400)
RBC: 4.5 MIL/uL (ref 4.22–5.81)
RDW: 12.2 % (ref 11.5–15.5)
WBC: 5.8 10*3/uL (ref 4.0–10.5)

## 2018-07-03 LAB — INFLUENZA PANEL BY PCR (TYPE A & B)
INFLAPCR: NEGATIVE
Influenza B By PCR: POSITIVE — AB

## 2018-07-03 LAB — CK: CK TOTAL: 70 U/L (ref 49–397)

## 2018-07-03 MED ORDER — OSELTAMIVIR PHOSPHATE 75 MG PO CAPS: 75.0000 mg | ORAL_CAPSULE | Freq: Two times a day (BID) | ORAL | 0 refills | Status: DC

## 2018-07-03 MED ORDER — SODIUM CHLORIDE 0.9 % IV BOLUS (SEPSIS)
1000.0000 mL | Freq: Once | INTRAVENOUS | Status: AC
  Administered 2018-07-03: 1000 mL via INTRAVENOUS

## 2018-07-03 MED ORDER — OSELTAMIVIR PHOSPHATE 75 MG PO CAPS
75.0000 mg | ORAL_CAPSULE | Freq: Once | ORAL | Status: AC
  Administered 2018-07-03: 75 mg via ORAL
  Filled 2018-07-03: qty 1

## 2018-07-03 MED ORDER — ONDANSETRON HCL 4 MG/2ML IJ SOLN
4.0000 mg | Freq: Once | INTRAMUSCULAR | Status: AC
  Administered 2018-07-03: 4 mg via INTRAVENOUS
  Filled 2018-07-03: qty 2

## 2018-07-03 MED ORDER — ACETAMINOPHEN 500 MG PO TABS
1000.0000 mg | ORAL_TABLET | Freq: Once | ORAL | Status: AC
  Administered 2018-07-03: 1000 mg via ORAL
  Filled 2018-07-03: qty 2

## 2018-07-03 MED ORDER — ONDANSETRON 4 MG PO TBDP: 4.0000 mg | ORAL_TABLET | Freq: Three times a day (TID) | ORAL | 0 refills | Status: DC | PRN

## 2018-07-03 MED ORDER — SODIUM CHLORIDE 0.9 % IV SOLN
1000.0000 mL | INTRAVENOUS | Status: DC
  Administered 2018-07-03: 1000 mL via INTRAVENOUS

## 2018-07-03 MED ORDER — IBUPROFEN 800 MG PO TABS
800.0000 mg | ORAL_TABLET | Freq: Once | ORAL | Status: AC
  Administered 2018-07-03: 800 mg via ORAL
  Filled 2018-07-03: qty 1

## 2018-07-03 MED ORDER — SODIUM CHLORIDE 0.9 % IV BOLUS (SEPSIS)
1000.0000 mL | Freq: Once | INTRAVENOUS | Status: AC
Start: 2018-07-03 — End: 2018-07-03
  Administered 2018-07-03: 1000 mL via INTRAVENOUS

## 2018-07-03 NOTE — ED Provider Notes (Signed)
[facility] EMERGENCY DEPARTMENT Provider Note   CSN: [id number] Arrival date & time: 07/03/18  1547     History   Chief Complaint Chief Complaint  Patient presents with  . Emesis    HPI Edward Krause is a 33 y.o. male.  HPI  The patient is a 33 year old male, he presents to the hospital today with a complaint of fevers, aching, coughing, nausea vomiting and lower abdominal pain.  The patient states that he symptoms started 4 days ago as a cold, mild, he took some over-the-counter medication, the next day got worse and then he states that for the last 36 hours he has not left the house in fact he states he went to sleep 11:00 2 nights ago and did not wake up until this morning.  He does not recall anything from yesterday.  He states that today he has had high fevers (subjectively).  He has taken a Goody powder which she states usually knocks things out but he is continued to get worse with aching especially in the abdomen with associated nausea and vomiting.  He is coughing occasionally.  Nothing seems to make this better or worse, he reports that he has had no other sick contacts other than a child who was sick a month ago with pneumonia.  He is not currently employed actively, he does smoke cigarettes but stopped 1 week ago, occasional alcohol.  Denies any other medical history and denies any abdominal surgical history.  Past Medical History:  Diagnosis Date  . Back injury    July 2016  . Back pain     There are no active problems to display for this patient.   History reviewed. No pertinent surgical history.      Home Medications    Prior to Admission medications   Medication Sig Start Date End Date Taking? Authorizing Provider  ondansetron (ZOFRAN ODT) 4 MG disintegrating tablet Take 1 tablet (4 mg total) by mouth every 8 (eight) hours as needed for nausea. 07/03/18   Eber HongMiller, Rodriques Badie, MD  oseltamivir (TAMIFLU) 75 MG capsule Take 1 capsule (75 mg total) by mouth every 12  (twelve) hours. 07/03/18   Eber HongMiller, Adoni Greenough, MD    Family History No family history on file.  Social History Social History   Tobacco Use  . Smoking status: Former Smoker    Packs/day: 1.00    Years: 10.00    Pack years: 10.00    Types: Cigarettes    Last attempt to quit: 06/26/2018    Years since quitting: 0.0  . Smokeless tobacco: Never Used  Substance Use Topics  . Alcohol use: No  . Drug use: No     Allergies   Poison sumac extract   Review of Systems Review of Systems  All other systems reviewed and are negative.    Physical Exam Updated Vital Signs BP (!) 102/58 (BP Location: Right Arm)   Pulse 100   Temp 98.6 F (37 C) (Oral)   Resp 18   Ht 1.727 m (5\' 8" )   Wt 54.4 kg   SpO2 96%   BMI 18.25 kg/m   Physical Exam Vitals signs and nursing note reviewed.  Constitutional:      General: He is not in acute distress.    Appearance: He is well-developed.  HENT:     Head: Normocephalic and atraumatic.     Mouth/Throat:     Pharynx: No oropharyngeal exudate.  Eyes:     General: No scleral icterus.  Right eye: No discharge.        Left eye: No discharge.     Conjunctiva/sclera: Conjunctivae normal.     Pupils: Pupils are equal, round, and reactive to light.  Neck:     Musculoskeletal: Normal range of motion and neck supple.     Thyroid: No thyromegaly.     Vascular: No JVD.  Cardiovascular:     Rate and Rhythm: Regular rhythm. Tachycardia present.     Heart sounds: Normal heart sounds. No murmur. No friction rub. No gallop.   Pulmonary:     Effort: Pulmonary effort is normal. No respiratory distress.     Breath sounds: Normal breath sounds. No wheezing or rales.  Abdominal:     General: Bowel sounds are normal. There is no distension.     Palpations: Abdomen is soft. There is no mass.     Tenderness: There is abdominal tenderness.     Comments: Focal tenderness to the right lower quadrant and right mid abdomen, no other abdominal tenderness    Musculoskeletal: Normal range of motion.        General: No tenderness.  Lymphadenopathy:     Cervical: No cervical adenopathy.  Skin:    General: Skin is warm and dry.     Findings: No erythema or rash.  Neurological:     Mental Status: He is alert.     Coordination: Coordination normal.  Psychiatric:        Behavior: Behavior normal.      ED Treatments / Results  Labs (all labs ordered are listed, but only abnormal results are displayed) Labs Reviewed  INFLUENZA PANEL BY PCR (TYPE A & B) - Abnormal; Notable for the following components:      Result Value   Influenza B By PCR POSITIVE (*)    All other components within normal limits  CBC WITH DIFFERENTIAL/PLATELET - Abnormal; Notable for the following components:   Lymphs Abs 0.4 (*)    Monocytes Absolute 1.1 (*)    All other components within normal limits  COMPREHENSIVE METABOLIC PANEL - Abnormal; Notable for the following components:   Glucose, Bld 129 (*)    All other components within normal limits  URINALYSIS, ROUTINE W REFLEX MICROSCOPIC  CK    EKG None  Radiology Dg Chest 2 View  Result Date: 07/03/2018 CLINICAL DATA:  Cough and fever EXAM: CHEST - 2 VIEW COMPARISON:  September 19, 2016 FINDINGS: The heart size and mediastinal contours are within normal limits. Both lungs are clear. The lungs are hyperinflated. The visualized skeletal structures are stable. IMPRESSION: No active cardiopulmonary disease.  Hyperinflated lungs. Electronically Signed   By: Sherian ReinWei-Chen  Lin M.D.   On: 07/03/2018 17:01    Procedures Procedures (including critical care time)  Medications Ordered in ED Medications  sodium chloride 0.9 % bolus 1,000 mL (0 mLs Intravenous Stopped 07/03/18 1701)    Followed by  sodium chloride 0.9 % bolus 1,000 mL (0 mLs Intravenous Stopped 07/03/18 1759)    Followed by  0.9 %  sodium chloride infusion (1,000 mLs Intravenous New Bag/Given 07/03/18 1755)  acetaminophen (TYLENOL) tablet 1,000 mg (1,000 mg  Oral Given 07/03/18 1624)  ondansetron (ZOFRAN) injection 4 mg (4 mg Intravenous Given 07/03/18 1631)  oseltamivir (TAMIFLU) capsule 75 mg (75 mg Oral Given 07/03/18 1754)  ibuprofen (ADVIL,MOTRIN) tablet 800 mg (800 mg Oral Given 07/03/18 1856)     Initial Impression / Assessment and Plan / ED Course  I have reviewed the triage vital signs  and the nursing notes.  Pertinent labs & imaging results that were available during my care of the patient were reviewed by me and considered in my medical decision making (see chart for details).  Clinical Course as of Jul 03 1942  Sat Jul 03, 2018  1714 Flu be positive, no leukocytosis   [BM]  1715 Lungs are hyperinflated but there is absolutely no signs of pneumonia.  This is reassuring.  The patient has been given Tylenol Zofran and fluids, he is feeling better already   [BM]  1715 Given these findings I do not think it is necessary to go looking for appendicitis.   [BM]    Clinical Course User Index [BM] Eber Hong, MD    The history of myalgias fevers nausea headache and coughing suggest an underlying influenza-like illness however with vomiting and abdominal discomfort I do not feel comfortable without formal testing.  Labs have been ordered including influenza swab, chest x-ray, may need imaging of the abdomen if no other causes found.  The patient is agreeable.  In the meantime he will have his fever treated with Tylenol, Zofran for nausea and IV fluids.  Less tenderness in the abdomen, no leukocytosis, flu positive, anticipate discharge, patient agreeable, fever has defervesced and vital signs are now normal  Vitals:   07/03/18 1554 07/03/18 1730 07/03/18 1817  BP: 108/78 105/67 (!) 102/58  Pulse: (!) 113 (!) 111 100  Resp: 18 18 18   Temp: (!) 102.5 F (39.2 C)  98.6 F (37 C)  TempSrc: Oral  Oral  SpO2: 97% 96% 96%  Weight: 54.4 kg    Height: 1.727 m (5\' 8" )       Final Clinical Impressions(s) / ED Diagnoses   Final  diagnoses:  Influenza B    ED Discharge Orders         Ordered    oseltamivir (TAMIFLU) 75 MG capsule  Every 12 hours     07/03/18 1943    ondansetron (ZOFRAN ODT) 4 MG disintegrating tablet  Every 8 hours PRN     07/03/18 1943           Eber Hong, MD 07/03/18 1945

## 2018-07-03 NOTE — ED Triage Notes (Signed)
Patient has multiple complaints. Per patient vomiting, body aches, cough, fevers, and bilaterally ear pain. Denies any diarrhea. Per patient symptoms started x4 days. Daughter sick with similar symptoms.

## 2018-07-03 NOTE — ED Notes (Signed)
Patient transported to X-ray 

## 2018-07-03 NOTE — Discharge Instructions (Signed)
Please return to the emergency department for any severe or worsening symptoms.  It appears that you do have the flu, take Tamiflu twice a day for the next 5 days, Zofran every 6 hours as needed for nausea, drink plenty of fluids and rest.  You should stay away from others for the next 7 days or until you are fever free for 24 hours without medications.  Alternate 1000 mg of Tylenol and 800 mg of ibuprofen every 4 hours as needed for fever and body aches

## 2021-08-07 ENCOUNTER — Other Ambulatory Visit: Payer: Self-pay

## 2021-08-07 ENCOUNTER — Ambulatory Visit (HOSPITAL_COMMUNITY)
Admission: RE | Admit: 2021-08-07 | Discharge: 2021-08-07 | Disposition: A | Discharge status: Home / Self Care | Payer: Medicaid Other | Source: Ambulatory Visit | Attending: Anesthesiology | Admitting: Anesthesiology

## 2021-08-07 ENCOUNTER — Other Ambulatory Visit (HOSPITAL_COMMUNITY): Payer: Self-pay | Admitting: Anesthesiology

## 2021-08-07 DIAGNOSIS — M5136 Other intervertebral disc degeneration, lumbar region: Secondary | ICD-10-CM | POA: Insufficient documentation

## 2023-01-06 ENCOUNTER — Emergency Department (HOSPITAL_COMMUNITY)
Admission: EM | Admit: 2023-01-06 | Discharge: 2023-01-06 | Disposition: A | Discharge status: Home / Self Care | Payer: Medicaid Other | Source: Home / Self Care | Attending: Emergency Medicine | Admitting: Emergency Medicine

## 2023-01-06 ENCOUNTER — Other Ambulatory Visit: Payer: Self-pay

## 2023-01-06 ENCOUNTER — Emergency Department (HOSPITAL_COMMUNITY): Payer: Medicaid Other

## 2023-01-06 DIAGNOSIS — D72829 Elevated white blood cell count, unspecified: Secondary | ICD-10-CM | POA: Insufficient documentation

## 2023-01-06 DIAGNOSIS — Z87891 Personal history of nicotine dependence: Secondary | ICD-10-CM | POA: Diagnosis not present

## 2023-01-06 DIAGNOSIS — J189 Pneumonia, unspecified organism: Secondary | ICD-10-CM | POA: Insufficient documentation

## 2023-01-06 DIAGNOSIS — R0602 Shortness of breath: Secondary | ICD-10-CM | POA: Diagnosis present

## 2023-01-06 LAB — CBC
HCT: 32.5 % — ABNORMAL LOW (ref 39.0–52.0)
Hemoglobin: 10.9 g/dL — ABNORMAL LOW (ref 13.0–17.0)
MCH: 30.8 pg (ref 26.0–34.0)
MCHC: 33.5 g/dL (ref 30.0–36.0)
MCV: 91.8 fL (ref 80.0–100.0)
Platelets: 338 10*3/uL (ref 150–400)
RBC: 3.54 MIL/uL — ABNORMAL LOW (ref 4.22–5.81)
RDW: 12.2 % (ref 11.5–15.5)
WBC: 13.2 10*3/uL — ABNORMAL HIGH (ref 4.0–10.5)
nRBC: 0 % (ref 0.0–0.2)

## 2023-01-06 LAB — BASIC METABOLIC PANEL
Anion gap: 9 (ref 5–15)
BUN: 10 mg/dL (ref 6–20)
CO2: 25 mmol/L (ref 22–32)
Calcium: 8.5 mg/dL — ABNORMAL LOW (ref 8.9–10.3)
Chloride: 101 mmol/L (ref 98–111)
Creatinine, Ser: 0.83 mg/dL (ref 0.61–1.24)
GFR, Estimated: >60 mL/min (ref >60)
Glucose, Bld: 145 mg/dL — ABNORMAL HIGH (ref 70–99)
Potassium: 3.3 mmol/L — ABNORMAL LOW (ref 3.5–5.1)
Sodium: 135 mmol/L (ref 135–145)

## 2023-01-06 LAB — TROPONIN I (HIGH SENSITIVITY): Troponin I (High Sensitivity): <2 ng/L (ref <18)

## 2023-01-06 MED ORDER — AZITHROMYCIN 250 MG PO TABS
500.0000 mg | ORAL_TABLET | Freq: Once | ORAL | Status: AC
  Administered 2023-01-06: 500 mg via ORAL
  Filled 2023-01-06: qty 2

## 2023-01-06 MED ORDER — SODIUM CHLORIDE 0.9 % IV BOLUS
1000.0000 mL | Freq: Once | INTRAVENOUS | Status: AC
  Administered 2023-01-06: 1000 mL via INTRAVENOUS

## 2023-01-06 MED ORDER — CEFDINIR 300 MG PO CAPS: 300.0000 mg | ORAL_CAPSULE | Freq: Two times a day (BID) | ORAL | 0 refills | Status: AC

## 2023-01-06 MED ORDER — CEFDINIR 300 MG PO CAPS
300.0000 mg | ORAL_CAPSULE | Freq: Two times a day (BID) | ORAL | Status: DC
  Administered 2023-01-06: 300 mg via ORAL
  Filled 2023-01-06: qty 1

## 2023-01-06 MED ORDER — IOHEXOL 350 MG/ML SOLN
75.0000 mL | Freq: Once | INTRAVENOUS | Status: AC | PRN
  Administered 2023-01-06: 75 mL via INTRAVENOUS

## 2023-01-06 MED ORDER — AZITHROMYCIN 250 MG PO TABS: 250.0000 mg | ORAL_TABLET | Freq: Every day | ORAL | 0 refills | Status: AC

## 2023-01-06 MED ORDER — KETOROLAC TROMETHAMINE 15 MG/ML IJ SOLN
30.0000 mg | Freq: Once | INTRAMUSCULAR | Status: AC
  Administered 2023-01-06: 30 mg via INTRAVENOUS

## 2023-01-06 MED ORDER — NAPROXEN 500 MG PO TABS: 500.0000 mg | ORAL_TABLET | Freq: Two times a day (BID) | ORAL | 0 refills | Status: DC

## 2023-01-06 NOTE — ED Provider Notes (Signed)
[facility]-EMERGENCY DEPT [facility] Emergency Department Provider Note MRN:  [id number]  Arrival date & time: 01/06/23     Chief Complaint   Shortness of Breath   History of Present Illness   Edward Krause is a 37 y.o. year-old male with no pertinent past medical history presenting to the ED with chief complaint of breath.  Shortness of breath and right-sided chest pain, pain radiates to the right thoracic back.  Ongoing for 3 days.  Review of Systems  A thorough review of systems was obtained and all systems are negative except as noted in the HPI and PMH.   Patient's Health History    Past Medical History:  Diagnosis Date   Back injury    July 2016   Back pain     No past surgical history on file.  No family history on file.  Social History   Socioeconomic History   Marital status: Divorced    Spouse name: Not on file   Number of children: Not on file   Years of education: Not on file   Highest education level: Not on file  Occupational History   Not on file  Tobacco Use   Smoking status: Former    Current packs/day: 0.00    Average packs/day: 1 pack/day for 10.0 years (10.0 ttl pk-yrs)    Types: Cigarettes    Start date: 06/26/2008    Quit date: 06/26/2018    Years since quitting: 4.5   Smokeless tobacco: Never  Vaping Use   Vaping status: Never Used  Substance and Sexual Activity   Alcohol use: No   Drug use: No   Sexual activity: Never  Other Topics Concern   Not on file  Social History Narrative   Not on file   Social Determinants of Health   Financial Resource Strain: Not on file  Food Insecurity: Not on file  Transportation Needs: Not on file  Physical Activity: Not on file  Stress: Not on file  Social Connections: Not on file  Intimate Partner Violence: Not on file     Physical Exam   Vitals:   01/06/23 0315 01/06/23 0345  BP: 113/65   Pulse: (!) 120 (!) 113  Resp:  (!) 30  Temp:    SpO2: 99% 96%    CONSTITUTIONAL: Chronically  ill-appearing, NAD NEURO/PSYCH:  Alert and oriented x 3, no focal deficits EYES:  eyes equal and reactive ENT/NECK:  no LAD, no JVD CARDIO: Tachycardic rate, well-perfused, normal S1 and S2 PULM: Tachypneic, lungs clear no wheeze GI/GU:  non-distended, non-tender MSK/SPINE:  No gross deformities, no edema SKIN:  no rash, atraumatic   *Additional and/or pertinent findings included in MDM below  Diagnostic and Interventional Summary    EKG Interpretation Date/Time:  Tuesday January 06 2023 02:40:01 EDT Ventricular Rate:  127 PR Interval:  146 QRS Duration:  99 QT Interval:  303 QTC Calculation: 441 R Axis:   85  Text Interpretation: Sinus tachycardia Ventricular premature complex Aberrant complex Confirmed by Kennis Carina [phone]) on 01/06/2023 4:15:55 AM       Labs Reviewed  CBC - Abnormal; Notable for the following components:      Result Value   WBC 13.2 (*)    RBC 3.54 (*)    Hemoglobin 10.9 (*)    HCT 32.5 (*)    All other components within normal limits  BASIC METABOLIC PANEL - Abnormal; Notable for the following components:   Potassium 3.3 (*)    Glucose, Bld 145 (*)  Calcium 8.5 (*)    All other components within normal limits  TROPONIN I (HIGH SENSITIVITY)  TROPONIN I (HIGH SENSITIVITY)    CT Angio Chest Pulmonary Embolism (PE) W or WO Contrast  Final Result    DG Chest Port 1 View  Final Result      Medications  cefdinir (OMNICEF) capsule 300 mg (has no administration in time range)  azithromycin (ZITHROMAX) tablet 500 mg (has no administration in time range)  ketorolac (TORADOL) 15 MG/ML injection 30 mg (30 mg Intravenous Given 01/06/23 0252)  sodium chloride 0.9 % bolus 1,000 mL (0 mLs Intravenous Stopped 01/06/23 0349)  iohexol (OMNIPAQUE) 350 MG/ML injection 75 mL (75 mLs Intravenous Contrast Given 01/06/23 0335)     Procedures  /  Critical Care Procedures  ED Course and Medical Decision Making  Initial Impression and Ddx Tachycardic,  tachypneic, right-sided chest pain.  Differential diagnosis includes pneumothorax, PE, less likely dissection, ACS.  Pneumonia also considered.  Past medical/surgical history that increases complexity of ED encounter: None  Interpretation of Diagnostics I personally reviewed the EKG and my interpretation is as follows: Sinus tachycardia  Labs overall reassuring with no significant blood count or electrolyte disturbance, mild leukocytosis.  Patient Reassessment and Ultimate Disposition/Management     CTA is without PE, there is a round appearing pneumonia on the right that explains patient's symptoms well.  On reassessment heart rate down to 105, he is no longer tachypneic, looks much more comfortable.  No indication for admission at this time, appropriate for discharge.  Patient management required discussion with the following services or consulting groups:  None  Complexity of Problems Addressed Acute illness or injury that poses threat of life of bodily function  Additional Data Reviewed and Analyzed Further history obtained from: Further history from spouse/family member  Additional Factors Impacting ED Encounter Risk Prescriptions and Consideration of hospitalization  Elmer Sow. Pilar Plate, MD [facility] Emergency Medicine [facility] [email or web address]  Final Clinical Impressions(s) / ED Diagnoses     ICD-10-CM   1. Community acquired pneumonia of right lung, unspecified part of lung  J18.9       ED Discharge Orders          Ordered    naproxen (NAPROSYN) 500 MG tablet  2 times daily        01/06/23 0416    cefdinir (OMNICEF) 300 MG capsule  2 times daily        01/06/23 0416    azithromycin (ZITHROMAX) 250 MG tablet  Daily        01/06/23 0416             Discharge Instructions Discussed with and Provided to Patient:    Discharge Instructions      You were evaluated in the Emergency Department and after careful evaluation, we did not find  any emergent condition requiring admission or further testing in the hospital.  Your exam/testing today was overall reassuring.  Symptoms seem to be due to pneumonia.  Take the Omnicef and azithromycin antibiotics as directed.  Use the Naprosyn anti-inflammatory as needed for pain.  Radiology is recommending that you undergo repeat CT imaging in 3 to 4 weeks to make sure that the pneumonia has resolved.  This can be planned by your primary care doctor.  Please return to the Emergency Department if you experience any worsening of your condition.  Thank you for allowing [place] to be a part of your care.       Telecia Larocque,  Elmer Sow, MD 01/06/23 [phone]

## 2023-01-06 NOTE — Discharge Instructions (Signed)
You were evaluated in the Emergency Department and after careful evaluation, we did not find any emergent condition requiring admission or further testing in the hospital.  Your exam/testing today was overall reassuring.  Symptoms seem to be due to pneumonia.  Take the Omnicef and azithromycin antibiotics as directed.  Use the Naprosyn anti-inflammatory as needed for pain.  Radiology is recommending that you undergo repeat CT imaging in 3 to 4 weeks to make sure that the pneumonia has resolved.  This can be planned by your primary care doctor.  Please return to the Emergency Department if you experience any worsening of your condition.  Thank you for allowing [place] to be a part of your care.

## 2023-01-06 NOTE — ED Triage Notes (Signed)
Pt c/o SOB and and right sided chest pain that's worse with movement x3 days.  Reports he has been taking his moms breathing treatments at home with no relief

## 2023-08-02 ENCOUNTER — Encounter (HOSPITAL_COMMUNITY): Payer: Self-pay | Admitting: *Deleted

## 2023-08-02 ENCOUNTER — Emergency Department (HOSPITAL_COMMUNITY): Payer: Medicaid Other

## 2023-08-02 ENCOUNTER — Other Ambulatory Visit: Payer: Self-pay

## 2023-08-02 ENCOUNTER — Emergency Department (HOSPITAL_COMMUNITY)
Admission: EM | Admit: 2023-08-02 | Discharge: 2023-08-02 | Disposition: A | Discharge status: Home / Self Care | Payer: Medicaid Other | Attending: Emergency Medicine | Admitting: Emergency Medicine

## 2023-08-02 DIAGNOSIS — J181 Lobar pneumonia, unspecified organism: Secondary | ICD-10-CM | POA: Diagnosis not present

## 2023-08-02 DIAGNOSIS — Z79899 Other long term (current) drug therapy: Secondary | ICD-10-CM | POA: Diagnosis not present

## 2023-08-02 DIAGNOSIS — R0602 Shortness of breath: Secondary | ICD-10-CM | POA: Diagnosis present

## 2023-08-02 DIAGNOSIS — J189 Pneumonia, unspecified organism: Secondary | ICD-10-CM

## 2023-08-02 LAB — CBC
HCT: 35.9 % — ABNORMAL LOW (ref 39.0–52.0)
Hemoglobin: 12 g/dL — ABNORMAL LOW (ref 13.0–17.0)
MCH: 31 pg (ref 26.0–34.0)
MCHC: 33.4 g/dL (ref 30.0–36.0)
MCV: 92.8 fL (ref 80.0–100.0)
Platelets: 411 10*3/uL — ABNORMAL HIGH (ref 150–400)
RBC: 3.87 MIL/uL — ABNORMAL LOW (ref 4.22–5.81)
RDW: 11.9 % (ref 11.5–15.5)
WBC: 17.6 10*3/uL — ABNORMAL HIGH (ref 4.0–10.5)
nRBC: 0 % (ref 0.0–0.2)

## 2023-08-02 LAB — BASIC METABOLIC PANEL
Anion gap: 11 (ref 5–15)
BUN: 10 mg/dL (ref 6–20)
CO2: 22 mmol/L (ref 22–32)
Calcium: 8.1 mg/dL — ABNORMAL LOW (ref 8.9–10.3)
Chloride: 104 mmol/L (ref 98–111)
Creatinine, Ser: 0.84 mg/dL (ref 0.61–1.24)
GFR, Estimated: >60 mL/min (ref >60)
Glucose, Bld: 125 mg/dL — ABNORMAL HIGH (ref 70–99)
Potassium: 3.7 mmol/L (ref 3.5–5.1)
Sodium: 137 mmol/L (ref 135–145)

## 2023-08-02 LAB — RESP PANEL BY RT-PCR (RSV, FLU A&B, COVID)  RVPGX2
Influenza A by PCR: NEGATIVE
Influenza B by PCR: NEGATIVE
Resp Syncytial Virus by PCR: NEGATIVE
SARS Coronavirus 2 by RT PCR: NEGATIVE

## 2023-08-02 MED ORDER — SODIUM CHLORIDE 0.9 % IV SOLN
1.0000 g | Freq: Once | INTRAVENOUS | Status: AC
  Administered 2023-08-02: 1 g via INTRAVENOUS
  Filled 2023-08-02: qty 10

## 2023-08-02 MED ORDER — ACETAMINOPHEN 500 MG PO TABS
1000.0000 mg | ORAL_TABLET | Freq: Once | ORAL | Status: AC
  Administered 2023-08-02: 1000 mg via ORAL
  Filled 2023-08-02: qty 2

## 2023-08-02 MED ORDER — ONDANSETRON HCL 4 MG/2ML IJ SOLN
4.0000 mg | Freq: Once | INTRAMUSCULAR | Status: AC
  Administered 2023-08-02: 4 mg via INTRAVENOUS
  Filled 2023-08-02: qty 2

## 2023-08-02 MED ORDER — SODIUM CHLORIDE 0.9 % IV BOLUS
1000.0000 mL | Freq: Once | INTRAVENOUS | Status: AC
  Administered 2023-08-02: 1000 mL via INTRAVENOUS

## 2023-08-02 MED ORDER — ONDANSETRON HCL 4 MG PO TABS: 4.0000 mg | ORAL_TABLET | Freq: Four times a day (QID) | ORAL | 0 refills | Status: DC

## 2023-08-02 MED ORDER — HYDROCODONE-ACETAMINOPHEN 5-325 MG PO TABS
1.0000 | ORAL_TABLET | Freq: Once | ORAL | Status: AC
  Administered 2023-08-02: 1 via ORAL
  Filled 2023-08-02: qty 1

## 2023-08-02 MED ORDER — DOXYCYCLINE HYCLATE 100 MG PO CAPS: 100.0000 mg | ORAL_CAPSULE | Freq: Two times a day (BID) | ORAL | 0 refills | Status: DC

## 2023-08-02 MED ORDER — SODIUM CHLORIDE 0.9 % IV SOLN
500.0000 mg | Freq: Once | INTRAVENOUS | Status: AC
  Administered 2023-08-02: 500 mg via INTRAVENOUS
  Filled 2023-08-02: qty 5

## 2023-08-02 MED ORDER — AMOXICILLIN 500 MG PO CAPS: 1000.0000 mg | ORAL_CAPSULE | Freq: Three times a day (TID) | ORAL | 0 refills | Status: DC

## 2023-08-02 NOTE — ED Provider Notes (Addendum)
 [place] [facility] DEPARTMENT AT [facility] Provider Note   CSN: [id number] Arrival date & time: 08/02/23  1535     History  Chief Complaint  Patient presents with   Shortness of Breath    Edward Krause is a 38 y.o. male with history of back pain.  The patient presents to the ED for evaluation of shortness of breath and right-sided chest wall pain.  Reports over the last 2 weeks he has been experiencing URI.  States his symptoms include cough, sore throat, fevers at home, nausea and vomiting.  Reports that in the last 1 week he developed a right sided pain around his chest wall/ribs that he reports is very similar to a past episode of pneumonia he had a few years ago.  Reports he has had fevers at home, productive cough.  Also reported nausea and vomiting.  Denies any chest pain, lightheadedness, dizziness, weakness or leg swelling.  Denies any recent surgery or travel, history of DVT or PE, unilateral leg swelling, hemoptysis, exogenous hormone use.   Shortness of Breath Associated symptoms: cough, fever and vomiting        Home Medications Prior to Admission medications   Medication Sig Start Date End Date Taking? Authorizing Provider  amoxicillin (AMOXIL) 500 MG capsule Take 2 capsules (1,000 mg total) by mouth 3 (three) times daily. 08/02/23  Yes Al Decant, PA-C  doxycycline (VIBRAMYCIN) 100 MG capsule Take 1 capsule (100 mg total) by mouth 2 (two) times daily. 08/02/23  Yes Al Decant, PA-C  ondansetron (ZOFRAN) 4 MG tablet Take 1 tablet (4 mg total) by mouth every 6 (six) hours. 08/02/23  Yes Al Decant, PA-C  naproxen (NAPROSYN) 500 MG tablet Take 1 tablet (500 mg total) by mouth 2 (two) times daily. 01/06/23   Sabas Sous, MD  ondansetron (ZOFRAN ODT) 4 MG disintegrating tablet Take 1 tablet (4 mg total) by mouth every 8 (eight) hours as needed for nausea. 07/03/18   Eber Hong, MD  oseltamivir (TAMIFLU) 75 MG capsule Take 1  capsule (75 mg total) by mouth every 12 (twelve) hours. 07/03/18   Eber Hong, MD      Allergies    Poison sumac extract    Review of Systems   Review of Systems  Constitutional:  Positive for fever.  Respiratory:  Positive for cough and shortness of breath.   Gastrointestinal:  Positive for nausea and vomiting.  All other systems reviewed and are negative.   Physical Exam Updated Vital Signs BP 122/86   Pulse (!) 116   Temp 100.3 F (37.9 C) (Oral)   Resp 16   Ht 5\' 9"  (1.753 m)   Wt 56.7 kg   SpO2 98%   BMI 18.46 kg/m  Physical Exam Vitals and nursing note reviewed.  Constitutional:      General: He is not in acute distress.    Appearance: Normal appearance. He is not ill-appearing, toxic-appearing or diaphoretic.  HENT:     Head: Normocephalic and atraumatic.     Nose: Nose normal.     Mouth/Throat:     Mouth: Mucous membranes are moist.     Pharynx: Oropharynx is clear.  Eyes:     Extraocular Movements: Extraocular movements intact.     Conjunctiva/sclera: Conjunctivae normal.     Pupils: Pupils are equal, round, and reactive to light.  Cardiovascular:     Rate and Rhythm: Regular rhythm. Tachycardia present.  Pulmonary:     Effort: Pulmonary effort  is normal.     Breath sounds: Normal breath sounds. No wheezing.  Abdominal:     General: Abdomen is flat. Bowel sounds are normal.     Palpations: Abdomen is soft.     Tenderness: There is no abdominal tenderness.  Musculoskeletal:     Cervical back: Normal range of motion and neck supple. No tenderness.  Skin:    General: Skin is warm and dry.     Capillary Refill: Capillary refill takes less than 2 seconds.  Neurological:     Mental Status: He is alert and oriented to person, place, and time.     ED Results / Procedures / Treatments   Labs (all labs ordered are listed, but only abnormal results are displayed) Labs Reviewed  CBC - Abnormal; Notable for the following components:      Result Value    WBC 17.6 (*)    RBC 3.87 (*)    Hemoglobin 12.0 (*)    HCT 35.9 (*)    Platelets 411 (*)    All other components within normal limits  BASIC METABOLIC PANEL - Abnormal; Notable for the following components:   Glucose, Bld 125 (*)    Calcium 8.1 (*)    All other components within normal limits  RESP PANEL BY RT-PCR (RSV, FLU A&B, COVID)  [id number]    EKG EKG Interpretation Date/Time:  Sunday August 02 2023 21:57:27 EST Ventricular Rate:  99 PR Interval:  109 QRS Duration:  107 QT Interval:  350 QTC Calculation: 450 R Axis:   83  Text Interpretation: Sinus rhythm Confirmed by Vivi Barrack [phone]) on 08/02/2023 10:49:36 PM  Radiology DG Chest 2 View Result Date: 08/02/2023 CLINICAL DATA:  Cough.  Shortness of breath. EXAM: CHEST - 2 VIEW COMPARISON:  01/06/2023. FINDINGS: There are patchy nonspecific opacities overlying the right lateral costophrenic angle region, which may represent atelectasis and/or pneumonitis. Correlate clinically. Bilateral lung fields are otherwise clear. No dense consolidation or lung collapse. No pulmonary edema. Bilateral costophrenic angles are clear. Normal cardio-mediastinal silhouette. No acute osseous abnormalities. The soft tissues are within normal limits. IMPRESSION: Patchy nonspecific opacities in the right lower lobe, which may represent atelectasis and/or pneumonitis. Correlate clinically. Electronically Signed   By: Jules Schick M.D.   On: 08/02/2023 17:11    Procedures Procedures   Medications Ordered in ED Medications  sodium chloride 0.9 % bolus 1,000 mL (0 mLs Intravenous Stopped 08/02/23 1846)  cefTRIAXone (ROCEPHIN) 1 g in sodium chloride 0.9 % 100 mL IVPB (0 g Intravenous Stopped 08/02/23 1846)  azithromycin (ZITHROMAX) 500 mg in sodium chloride 0.9 % 250 mL IVPB (0 mg Intravenous Stopped 08/02/23 2019)  acetaminophen (TYLENOL) tablet 1,000 mg (1,000 mg Oral Given 08/02/23 1759)  ondansetron (ZOFRAN) injection 4 mg (4 mg Intravenous Given  08/02/23 1800)  HYDROcodone-acetaminophen (NORCO/VICODIN) 5-325 MG per tablet 1 tablet (1 tablet Oral Given 08/02/23 1843)  sodium chloride 0.9 % bolus 1,000 mL (0 mLs Intravenous Stopped 08/02/23 2248)    ED Course/ Medical Decision Making/ A&P Clinical Course as of 08/02/23 2308  Sun Aug 02, 2023  1749 Here with 2 weeks of Uri symptoms to include fevers, cough, sore throat. In last 1 week developed right sided rib pain consistent with past epiosodes of pneumonia. States he has been febrile at home. Endorsing nausea and vomiting. SOB denies cp.  [CG]  2027 DG Chest 2 View [CG]    Clinical Course User Index [CG] Al Decant, PA-C   Medical Decision Making Amount  and/or Complexity of Data Reviewed Labs: ordered. Radiology: ordered. Decision-making details documented in ED Course.  Risk OTC drugs. Prescription drug management.   38 year old male presents for evaluation.  Please see HPI for further details.  On examination the patient has an elevated temperature to 100.3.  He is tachycardic to 118.  His lung sounds are clear bilaterally, he is not hypoxic with oxygen saturation 95% room air.  Abdomen soft and compressible throughout.  Neurological examinations baseline.  No edema to bilateral lower extremities.  Patient viral panel negative for all.  Chest x-ray shows a patchy nonspecific opacity in the right lower lobe which may represent atelectasis or pneumonitis.  Patient reports that this feels very similar to his past episode of pneumonia in 2024.    Patient tachycardia and elevated temperature could be secondary to infection.  Will provide patient with Rocephin, azithromycin, 1 L of fluid, Zofran for nausea.  On reassessment patient reports he feels much better.  Patient still tachycardic however suspect this is secondary to bacterial infection.  Will provide additional liter of fluid and reassess pulse rate.  After second liter of fluid, the patient remains tachycardic.   He has a white count of 17, baseline hemoglobin.  His metabolic panel shows a glucose 125 and electrolyte derangement.  Patient chart was reviewed.  Patient had similar presentation on 01/06/2023 where he was diagnosed with pneumonia, was tachycardic on discharge.  Patient had CTA at that time which was negative.  Had shared decision-making conversation with the patient.  Advised him of concern for PE and offered CTA to rule this out.  He reports that he feels as if his tachycardia is secondary to his pain and discomfort.  He is deferring on PE study at this time and reports that he would like to go home and trial antibiotics.  I gave the patient strict return precautions and advised him that if his pulse rate continues to be above 100 after 24 hours he will need to return to the ED for further management and he voiced understanding of my instructions.  Patient was given return precautions and he voiced understanding.  He all of his questions answered to satisfaction.  He is stable to discharge home.   Final Clinical Impression(s) / ED Diagnoses Final diagnoses:  Community acquired pneumonia of right lower lobe of lung    Rx / DC Orders ED Discharge Orders          Ordered    amoxicillin (AMOXIL) 500 MG capsule  3 times daily        08/02/23 2308    doxycycline (VIBRAMYCIN) 100 MG capsule  2 times daily        08/02/23 2308    ondansetron (ZOFRAN) 4 MG tablet  Every 6 hours        08/02/23 2308                  Al Decant, PA-C 08/02/23 2309    Loetta Rough, MD 08/02/23 [phone]

## 2023-08-02 NOTE — ED Notes (Signed)
 Patient discharged. Provider spoke to patient. Paperwork given to patient and reviewed. Pt verbalized understanding. VSS. A+Ox4. Patient ambulated out of the ER with steady, independent gait. Patient reminded if he changes his mind or has new or worsening symptoms he can come back to the ER. IV removed intact without complications.

## 2023-08-02 NOTE — Discharge Instructions (Addendum)
 It was a pleasure taking part in your care.  As we discussed, it seems as though you have pneumonia in your right lobe of your lungs.  Please begin taking amoxicillin 3 times a day for the next 5 days.  Please also take doxycycline twice a day for 5 days.  Please monitor your heart rate at home and if you are still above 100 bpm in 24 hours please return to the ED.  I offered you a CT scan to rule out a blood clot in your lungs but you are deferring on this at this time.  Please take Zofran every 6 hours as needed for nausea and vomiting.  Please read attached guide concerning pneumonia.  Follow-up with primary care doctor in 1 week.

## 2023-08-02 NOTE — ED Triage Notes (Signed)
 Pt with SOB x 1.5-2 weeks, pt concerned for PNA, pt has hx of PNA last year. + chills + N/V x 3 today.

## 2023-08-03 ENCOUNTER — Emergency Department (HOSPITAL_COMMUNITY): Payer: Medicaid Other

## 2023-08-03 ENCOUNTER — Other Ambulatory Visit: Payer: Self-pay

## 2023-08-03 ENCOUNTER — Encounter (HOSPITAL_COMMUNITY): Payer: Self-pay | Admitting: Emergency Medicine

## 2023-08-03 ENCOUNTER — Inpatient Hospital Stay (HOSPITAL_COMMUNITY)
Admission: EM | Admit: 2023-08-03 | Discharge: 2023-08-05 | DRG: 871 | Disposition: A | Discharge status: Home / Self Care | Payer: Medicaid Other | Attending: Internal Medicine | Admitting: Internal Medicine

## 2023-08-03 DIAGNOSIS — R0781 Pleurodynia: Secondary | ICD-10-CM

## 2023-08-03 DIAGNOSIS — Z8249 Family history of ischemic heart disease and other diseases of the circulatory system: Secondary | ICD-10-CM

## 2023-08-03 DIAGNOSIS — Z91048 Other nonmedicinal substance allergy status: Secondary | ICD-10-CM

## 2023-08-03 DIAGNOSIS — Z87891 Personal history of nicotine dependence: Secondary | ICD-10-CM

## 2023-08-03 DIAGNOSIS — D649 Anemia, unspecified: Secondary | ICD-10-CM | POA: Diagnosis present

## 2023-08-03 DIAGNOSIS — F1110 Opioid abuse, uncomplicated: Secondary | ICD-10-CM

## 2023-08-03 DIAGNOSIS — A419 Sepsis, unspecified organism: Principal | ICD-10-CM | POA: Diagnosis present

## 2023-08-03 DIAGNOSIS — J189 Pneumonia, unspecified organism: Principal | ICD-10-CM | POA: Diagnosis present

## 2023-08-03 LAB — CBC WITH DIFFERENTIAL/PLATELET
Abs Immature Granulocytes: 0.07 10*3/uL (ref 0.00–0.07)
Basophils Absolute: 0.1 10*3/uL (ref 0.0–0.1)
Basophils Relative: 1 %
Eosinophils Absolute: 0 10*3/uL (ref 0.0–0.5)
Eosinophils Relative: 0 %
HCT: 38.1 % — ABNORMAL LOW (ref 39.0–52.0)
Hemoglobin: 12.5 g/dL — ABNORMAL LOW (ref 13.0–17.0)
Immature Granulocytes: 0 %
Lymphocytes Relative: 17 %
Lymphs Abs: 3 10*3/uL (ref 0.7–4.0)
MCH: 30.9 pg (ref 26.0–34.0)
MCHC: 32.8 g/dL (ref 30.0–36.0)
MCV: 94.3 fL (ref 80.0–100.0)
Monocytes Absolute: 1.7 10*3/uL — ABNORMAL HIGH (ref 0.1–1.0)
Monocytes Relative: 10 %
Neutro Abs: 12.6 10*3/uL — ABNORMAL HIGH (ref 1.7–7.7)
Neutrophils Relative %: 72 %
Platelets: 438 10*3/uL — ABNORMAL HIGH (ref 150–400)
RBC: 4.04 MIL/uL — ABNORMAL LOW (ref 4.22–5.81)
RDW: 12 % (ref 11.5–15.5)
WBC: 17.5 10*3/uL — ABNORMAL HIGH (ref 4.0–10.5)
nRBC: 0 % (ref 0.0–0.2)

## 2023-08-03 NOTE — ED Triage Notes (Signed)
 Pt BIB [facility] from home c/o shortness of breath that has continued since yesterday after being dx with pneumonia.

## 2023-08-04 DIAGNOSIS — R0781 Pleurodynia: Secondary | ICD-10-CM | POA: Diagnosis not present

## 2023-08-04 DIAGNOSIS — A419 Sepsis, unspecified organism: Secondary | ICD-10-CM | POA: Diagnosis present

## 2023-08-04 DIAGNOSIS — F1110 Opioid abuse, uncomplicated: Secondary | ICD-10-CM | POA: Diagnosis present

## 2023-08-04 DIAGNOSIS — J189 Pneumonia, unspecified organism: Secondary | ICD-10-CM | POA: Diagnosis present

## 2023-08-04 DIAGNOSIS — D649 Anemia, unspecified: Secondary | ICD-10-CM | POA: Diagnosis present

## 2023-08-04 DIAGNOSIS — Z87891 Personal history of nicotine dependence: Secondary | ICD-10-CM | POA: Diagnosis not present

## 2023-08-04 DIAGNOSIS — Z91048 Other nonmedicinal substance allergy status: Secondary | ICD-10-CM | POA: Diagnosis not present

## 2023-08-04 DIAGNOSIS — Z8249 Family history of ischemic heart disease and other diseases of the circulatory system: Secondary | ICD-10-CM | POA: Diagnosis not present

## 2023-08-04 LAB — COMPREHENSIVE METABOLIC PANEL
ALT: 14 U/L (ref 0–44)
AST: 21 U/L (ref 15–41)
Albumin: 3.6 g/dL (ref 3.5–5.0)
Alkaline Phosphatase: 71 U/L (ref 38–126)
Anion gap: 9 (ref 5–15)
BUN: 12 mg/dL (ref 6–20)
CO2: 27 mmol/L (ref 22–32)
Calcium: 8.6 mg/dL — ABNORMAL LOW (ref 8.9–10.3)
Chloride: 103 mmol/L (ref 98–111)
Creatinine, Ser: 0.84 mg/dL (ref 0.61–1.24)
GFR, Estimated: >60 mL/min (ref >60)
Glucose, Bld: 103 mg/dL — ABNORMAL HIGH (ref 70–99)
Potassium: 4.1 mmol/L (ref 3.5–5.1)
Sodium: 139 mmol/L (ref 135–145)
Total Bilirubin: 0.5 mg/dL (ref 0.0–1.2)
Total Protein: 7.9 g/dL (ref 6.5–8.1)

## 2023-08-04 LAB — BASIC METABOLIC PANEL
Anion gap: 11 (ref 5–15)
BUN: 10 mg/dL (ref 6–20)
CO2: 24 mmol/L (ref 22–32)
Calcium: 8.2 mg/dL — ABNORMAL LOW (ref 8.9–10.3)
Chloride: 103 mmol/L (ref 98–111)
Creatinine, Ser: 0.76 mg/dL (ref 0.61–1.24)
GFR, Estimated: >60 mL/min (ref >60)
Glucose, Bld: 129 mg/dL — ABNORMAL HIGH (ref 70–99)
Potassium: 3.8 mmol/L (ref 3.5–5.1)
Sodium: 138 mmol/L (ref 135–145)

## 2023-08-04 LAB — CBC
HCT: 32.2 % — ABNORMAL LOW (ref 39.0–52.0)
Hemoglobin: 10.4 g/dL — ABNORMAL LOW (ref 13.0–17.0)
MCH: 30.1 pg (ref 26.0–34.0)
MCHC: 32.3 g/dL (ref 30.0–36.0)
MCV: 93.3 fL (ref 80.0–100.0)
Platelets: 332 10*3/uL (ref 150–400)
RBC: 3.45 MIL/uL — ABNORMAL LOW (ref 4.22–5.81)
RDW: 11.9 % (ref 11.5–15.5)
WBC: 16.1 10*3/uL — ABNORMAL HIGH (ref 4.0–10.5)
nRBC: 0 % (ref 0.0–0.2)

## 2023-08-04 LAB — PROTIME-INR
INR: 1.3 — ABNORMAL HIGH (ref 0.8–1.2)
Prothrombin Time: 16.7 s — ABNORMAL HIGH (ref 11.4–15.2)

## 2023-08-04 LAB — LACTIC ACID, PLASMA: Lactic Acid, Venous: 0.9 mmol/L (ref 0.5–1.9)

## 2023-08-04 LAB — HIV ANTIBODY (ROUTINE TESTING W REFLEX): HIV Screen 4th Generation wRfx: NONREACTIVE

## 2023-08-04 LAB — CORTISOL-AM, BLOOD: Cortisol - AM: 16.6 ug/dL (ref 6.7–22.6)

## 2023-08-04 MED ORDER — IPRATROPIUM-ALBUTEROL 0.5-2.5 (3) MG/3ML IN SOLN
3.0000 mL | Freq: Four times a day (QID) | RESPIRATORY_TRACT | Status: DC
  Administered 2023-08-04: 3 mL via RESPIRATORY_TRACT
  Filled 2023-08-04: qty 3

## 2023-08-04 MED ORDER — IOHEXOL 350 MG/ML SOLN
75.0000 mL | Freq: Once | INTRAVENOUS | Status: AC | PRN
  Administered 2023-08-04: 75 mL via INTRAVENOUS

## 2023-08-04 MED ORDER — SODIUM CHLORIDE 0.9 % IV SOLN
2.0000 g | Freq: Once | INTRAVENOUS | Status: AC
  Administered 2023-08-04: 2 g via INTRAVENOUS
  Filled 2023-08-04: qty 20

## 2023-08-04 MED ORDER — ONDANSETRON HCL 4 MG PO TABS: 4.0000 mg | ORAL_TABLET | Freq: Four times a day (QID) | ORAL | Status: DC | PRN

## 2023-08-04 MED ORDER — SODIUM CHLORIDE 0.9 % IV SOLN
2.0000 g | INTRAVENOUS | Status: DC
  Administered 2023-08-04: 2 g via INTRAVENOUS
  Filled 2023-08-04: qty 20

## 2023-08-04 MED ORDER — ENOXAPARIN SODIUM 40 MG/0.4ML IJ SOSY
40.0000 mg | PREFILLED_SYRINGE | INTRAMUSCULAR | Status: DC
Start: 2023-08-04 — End: 2023-08-05
  Administered 2023-08-04 – 2023-08-05 (×2): 40 mg via SUBCUTANEOUS
  Filled 2023-08-04 (×2): qty 0.4

## 2023-08-04 MED ORDER — ACETAMINOPHEN 650 MG RE SUPP: 650.0000 mg | Freq: Four times a day (QID) | RECTAL | Status: DC | PRN

## 2023-08-04 MED ORDER — ACETAMINOPHEN 325 MG PO TABS
650.0000 mg | ORAL_TABLET | Freq: Four times a day (QID) | ORAL | Status: DC | PRN
  Administered 2023-08-04 – 2023-08-05 (×2): 650 mg via ORAL
  Filled 2023-08-04 (×3): qty 2

## 2023-08-04 MED ORDER — LACTATED RINGERS IV SOLN: INTRAVENOUS | Status: AC

## 2023-08-04 MED ORDER — GUAIFENESIN ER 600 MG PO TB12
600.0000 mg | ORAL_TABLET | Freq: Two times a day (BID) | ORAL | Status: DC
  Administered 2023-08-04 – 2023-08-05 (×3): 600 mg via ORAL
  Filled 2023-08-04 (×3): qty 1

## 2023-08-04 MED ORDER — MORPHINE SULFATE (PF) 4 MG/ML IV SOLN
4.0000 mg | Freq: Once | INTRAVENOUS | Status: AC
  Administered 2023-08-04: 4 mg via INTRAVENOUS
  Filled 2023-08-04: qty 1

## 2023-08-04 MED ORDER — AZITHROMYCIN 250 MG PO TABS
250.0000 mg | ORAL_TABLET | Freq: Once | ORAL | Status: AC
  Administered 2023-08-04: 250 mg via ORAL
  Filled 2023-08-04: qty 1

## 2023-08-04 MED ORDER — SODIUM CHLORIDE 0.9 % IV SOLN
500.0000 mg | INTRAVENOUS | Status: DC
  Administered 2023-08-04 – 2023-08-05 (×2): 500 mg via INTRAVENOUS
  Filled 2023-08-04 (×2): qty 5

## 2023-08-04 MED ORDER — HYDROCOD POLI-CHLORPHE POLI ER 10-8 MG/5ML PO SUER
5.0000 mL | Freq: Two times a day (BID) | ORAL | Status: DC | PRN
  Administered 2023-08-04 (×2): 5 mL via ORAL
  Filled 2023-08-04 (×3): qty 5

## 2023-08-04 MED ORDER — KETOROLAC TROMETHAMINE 15 MG/ML IJ SOLN
15.0000 mg | Freq: Four times a day (QID) | INTRAMUSCULAR | Status: DC | PRN
  Administered 2023-08-04 (×3): 15 mg via INTRAVENOUS
  Filled 2023-08-04 (×4): qty 1

## 2023-08-04 MED ORDER — LACTATED RINGERS IV BOLUS
1000.0000 mL | Freq: Once | INTRAVENOUS | Status: AC
  Administered 2023-08-04: 1000 mL via INTRAVENOUS

## 2023-08-04 MED ORDER — KETOROLAC TROMETHAMINE 15 MG/ML IJ SOLN
15.0000 mg | Freq: Once | INTRAMUSCULAR | Status: AC
  Administered 2023-08-04: 15 mg via INTRAVENOUS
  Filled 2023-08-04: qty 1

## 2023-08-04 MED ORDER — ONDANSETRON HCL 4 MG/2ML IJ SOLN
4.0000 mg | Freq: Once | INTRAMUSCULAR | Status: AC
  Administered 2023-08-04: 4 mg via INTRAVENOUS
  Filled 2023-08-04: qty 2

## 2023-08-04 MED ORDER — LIDOCAINE 5 % EX PTCH
1.0000 | MEDICATED_PATCH | CUTANEOUS | Status: DC
  Administered 2023-08-04: 1 via TRANSDERMAL
  Filled 2023-08-04: qty 1

## 2023-08-04 MED ORDER — DEXTROSE 5 % IV SOLN
250.0000 mg | Freq: Once | INTRAVENOUS | Status: DC
  Filled 2023-08-04: qty 2.5

## 2023-08-04 MED ORDER — MAGNESIUM HYDROXIDE 400 MG/5ML PO SUSP: 30.0000 mL | Freq: Every day | ORAL | Status: DC | PRN

## 2023-08-04 MED ORDER — LACTATED RINGERS IV SOLN
150.0000 mL/h | INTRAVENOUS | Status: AC
Start: 2023-08-04 — End: 2023-08-04
  Administered 2023-08-04: 150 mL/h via INTRAVENOUS

## 2023-08-04 MED ORDER — ONDANSETRON HCL 4 MG/2ML IJ SOLN
4.0000 mg | Freq: Four times a day (QID) | INTRAMUSCULAR | Status: DC | PRN
  Administered 2023-08-04: 4 mg via INTRAVENOUS
  Filled 2023-08-04: qty 2

## 2023-08-04 MED ORDER — IPRATROPIUM-ALBUTEROL 0.5-2.5 (3) MG/3ML IN SOLN
3.0000 mL | Freq: Two times a day (BID) | RESPIRATORY_TRACT | Status: DC
  Filled 2023-08-04: qty 3

## 2023-08-04 MED ORDER — TRAZODONE HCL 50 MG PO TABS
25.0000 mg | ORAL_TABLET | Freq: Every evening | ORAL | Status: DC | PRN
  Administered 2023-08-04 (×2): 25 mg via ORAL
  Filled 2023-08-04 (×2): qty 1

## 2023-08-04 NOTE — ED Notes (Signed)
 ED TO INPATIENT HANDOFF REPORT  ED Nurse Name and Phone #:   S Name/Age/Gender Edward Krause 38 y.o. male Room/Bed: APA01/APA01  Code Status   Code Status: Full Code  Home/SNF/Other Home Patient oriented to: self, place, time, and situation Is this baseline? Yes   Triage Complete: Triage complete  Chief Complaint Sepsis due to pneumonia (HCC) [J18.9, A41.9]  Triage Note Pt BIB [facility] from home c/o shortness of breath that has continued since yesterday after being dx with pneumonia.    Allergies Allergies  Allergen Reactions   Poison Sumac Extract     Level of Care/Admitting Diagnosis ED Disposition     ED Disposition  Admit   Condition  --   Comment  Hospital Area: [facility] [[id number]]  Level of Care: Telemetry [5]  Covid Evaluation: Asymptomatic - no recent exposure (last 10 days) testing not required  Diagnosis: Sepsis due to pneumonia [facility]) [[id number]]  Admitting Physician: Hannah Beat [[id number]]  Attending Physician: Hannah Beat [[id number]]  Certification:: I certify this patient will need inpatient services for at least 2 midnights  Expected Medical Readiness: 08/06/2023          B Medical/Surgery History Past Medical History:  Diagnosis Date   Back injury    July 2016   Back pain    History reviewed. No pertinent surgical history.   A IV Location/Drains/Wounds Patient Lines/Drains/Airways Status     Active Line/Drains/Airways     Name Placement date Placement time Site Days   Peripheral IV 08/04/23 20 G 1" Right Antecubital 08/04/23  0034  Antecubital  less than 1            Intake/Output Last 24 hours  Intake/Output Summary (Last 24 hours) at 08/04/2023 2119 Last data filed at 08/04/2023 1422 Gross per 24 hour  Intake 2339 ml  Output 450 ml  Net 1889 ml    Labs/Imaging Results for orders placed or performed during the hospital encounter of 08/03/23 (from the past 48 hours)  Comprehensive metabolic panel     Status:  Abnormal   Collection Time: 08/03/23 11:39 PM  Result Value Ref Range   Sodium 139 135 - 145 mmol/L   Potassium 4.1 3.5 - 5.1 mmol/L   Chloride 103 98 - 111 mmol/L   CO2 27 22 - 32 mmol/L   Glucose, Bld 103 (H) 70 - 99 mg/dL    Comment: Glucose reference range applies only to samples taken after fasting for at least 8 hours.   BUN 12 6 - 20 mg/dL   Creatinine, Ser 5.78 0.61 - 1.24 mg/dL   Calcium 8.6 (L) 8.9 - 10.3 mg/dL   Total Protein 7.9 6.5 - 8.1 g/dL   Albumin 3.6 3.5 - 5.0 g/dL   AST 21 15 - 41 U/L   ALT 14 0 - 44 U/L   Alkaline Phosphatase 71 38 - 126 U/L   Total Bilirubin 0.5 0.0 - 1.2 mg/dL   GFR, Estimated >46 >96 mL/min    Comment: (NOTE) Calculated using the CKD-EPI Creatinine Equation (2021)    Anion gap 9 5 - 15    Comment: Performed at [facility], [address] [place], [place] [address]  CBC with Differential     Status: Abnormal   Collection Time: 08/03/23 11:39 PM  Result Value Ref Range   WBC 17.5 (H) 4.0 - 10.5 K/uL   RBC 4.04 (L) 4.22 - 5.81 MIL/uL   Hemoglobin 12.5 (L) 13.0 - 17.0 g/dL  HCT 38.1 (L) 39.0 - 52.0 %   MCV 94.3 80.0 - 100.0 fL   MCH 30.9 26.0 - 34.0 pg   MCHC 32.8 30.0 - 36.0 g/dL   RDW 14.7 82.9 - 56.2 %   Platelets 438 (H) 150 - 400 K/uL   nRBC 0.0 0.0 - 0.2 %   Neutrophils Relative % 72 %   Neutro Abs 12.6 (H) 1.7 - 7.7 K/uL   Lymphocytes Relative 17 %   Lymphs Abs 3.0 0.7 - 4.0 K/uL   Monocytes Relative 10 %   Monocytes Absolute 1.7 (H) 0.1 - 1.0 K/uL   Eosinophils Relative 0 %   Eosinophils Absolute 0.0 0.0 - 0.5 K/uL   Basophils Relative 1 %   Basophils Absolute 0.1 0.0 - 0.1 K/uL   Immature Granulocytes 0 %   Abs Immature Granulocytes 0.07 0.00 - 0.07 K/uL    Comment: Performed at [facility], [address] [place], [place] [address]  Lactic acid, plasma     Status: None   Collection Time: 08/04/23  1:28 AM  Result Value Ref Range   Lactic Acid, Venous 0.9 0.5 - 1.9 mmol/L    Comment: Performed at [facility], [address], [place], [place] [address]  Blood culture (routine x 2)     Status: None (Preliminary result)   Collection Time: 08/04/23  1:28 AM   Specimen: BLOOD  Result Value Ref Range   Specimen Description BLOOD LEFT ANTECUBITAL    Special Requests      BOTTLES DRAWN AEROBIC AND ANAEROBIC Blood Culture adequate volume   Culture      NO GROWTH < 12 HOURS Performed at [facility], [address], [place], [place] [address]    Report Status PENDING   Blood culture (routine x 2)     Status: None (Preliminary result)   Collection Time: 08/04/23  1:28 AM   Specimen: BLOOD  Result Value Ref Range   Specimen Description BLOOD BLOOD LEFT FOREARM    Special Requests      BOTTLES DRAWN AEROBIC AND ANAEROBIC Blood Culture adequate volume   Culture      NO GROWTH < 12 HOURS Performed at [facility], [address], [place], [place] [address]    Report Status PENDING   HIV Antibody (routine testing w rflx)     Status: None   Collection Time: 08/04/23  1:28 AM  Result Value Ref Range   HIV Screen 4th Generation wRfx Non Reactive Non Reactive    Comment: Performed at [facility], [address] [place], [place] [address]  Protime-INR     Status: Abnormal   Collection Time: 08/04/23  4:27 AM  Result Value Ref Range   Prothrombin Time 16.7 (H) 11.4 - 15.2 seconds   INR 1.3 (H) 0.8 - 1.2    Comment: (NOTE) INR goal varies based on device and disease states. Performed at [facility], [address] [place], [place] [address]   Cortisol-am, blood     Status: None   Collection Time: 08/04/23  4:27 AM  Result Value Ref Range   Cortisol - AM 16.6 6.7 - 22.6 ug/dL    Comment: Performed at [facility], [address] [place], [place] [address]  Basic metabolic panel     Status: Abnormal   Collection Time: 08/04/23  4:27 AM  Result Value Ref Range   Sodium 138 135 - 145 mmol/L   Potassium 3.8 3.5 - 5.1 mmol/L  Chloride 103 98 - 111 mmol/L   CO2 24 22 - 32 mmol/L    Glucose, Bld 129 (H) 70 - 99 mg/dL    Comment: Glucose reference range applies only to samples taken after fasting for at least 8 hours.   BUN 10 6 - 20 mg/dL   Creatinine, Ser 8.11 0.61 - 1.24 mg/dL   Calcium 8.2 (L) 8.9 - 10.3 mg/dL   GFR, Estimated >91 >47 mL/min    Comment: (NOTE) Calculated using the CKD-EPI Creatinine Equation (2021)    Anion gap 11 5 - 15    Comment: Performed at [facility], [address], [place], [place] [address]  CBC     Status: Abnormal   Collection Time: 08/04/23  4:27 AM  Result Value Ref Range   WBC 16.1 (H) 4.0 - 10.5 K/uL   RBC 3.45 (L) 4.22 - 5.81 MIL/uL   Hemoglobin 10.4 (L) 13.0 - 17.0 g/dL   HCT 21.3 (L) 08.6 - 57.8 %   MCV 93.3 80.0 - 100.0 fL   MCH 30.1 26.0 - 34.0 pg   MCHC 32.3 30.0 - 36.0 g/dL   RDW 46.9 62.9 - 52.8 %   Platelets 332 150 - 400 K/uL   nRBC 0.0 0.0 - 0.2 %    Comment: Performed at [facility], [address], [place], [place] [address]   CT Angio Chest PE W and/or Wo Contrast Result Date: 08/04/2023 CLINICAL DATA:  Shortness of breath for 2 days, initial encounter EXAM: CT ANGIOGRAPHY CHEST WITH CONTRAST TECHNIQUE: Multidetector CT imaging of the chest was performed using the standard protocol during bolus administration of intravenous contrast. Multiplanar CT image reconstructions and MIPs were obtained to evaluate the vascular anatomy. RADIATION DOSE REDUCTION: This exam was performed according to the departmental dose-optimization program which includes automated exposure control, adjustment of the mA and/or kV according to patient size and/or use of iterative reconstruction technique. CONTRAST:  75mL OMNIPAQUE IOHEXOL 350 MG/ML SOLN COMPARISON:  Chest x-ray from the previous day. FINDINGS: Cardiovascular: Thoracic aorta is within normal limits. No aneurysmal dilatation or dissection is noted. The heart is not significantly enlarged. No significant coronary calcifications are noted. The pulmonary artery shows a normal branching  pattern bilaterally. No intraluminal filling defect to suggest pulmonary embolism is noted. Mediastinum/Nodes: Thoracic inlet is within normal limits. No hilar or mediastinal adenopathy is noted. The esophagus as visualized is within normal limits. Small hilar lymph nodes are noted bilaterally likely reactive in nature. Lungs/Pleura: Small right-sided pleural effusion is noted. The lungs are well aerated bilaterally. Considerable mucous plugging is noted in the lower lobes bilaterally right greater than left. Some associated focal infiltrate in the right lower lobe is seen. Similar findings are also noted in the right middle lobe. Scarring is noted in the medial right middle lobe related to prior masslike density. Upper Abdomen: Visualized upper abdomen is within normal limits. Musculoskeletal: No acute bony abnormality is seen. Review of the MIP images confirms the above findings. IMPRESSION: No evidence of pulmonary emboli. Bilateral lower lobe and right middle lobe mucous plugging with focal infiltrate in the right lower lobe. Scarring is seen in the right middle lobe related to a prior area of infiltrate. Electronically Signed   By: Alcide Clever M.D.   On: 08/04/2023 01:00   DG Chest 2 View Result Date: 08/03/2023 CLINICAL DATA:  Shortness of breath, chest pain, productive cough. EXAM: CHEST - 2 VIEW COMPARISON:  PA and lateral chest yesterday at 4:19 p.m. FINDINGS: The  heart size and mediastinal contours are within normal limits. There is increasing opacity in the right lateral costophrenic sulcus area, trace right pleural fluid, opacity also localizing in the posterior chest base on the lateral view. Findings are most likely due to a small right lower lobe pneumonia. Remaining lungs are generally clear. The visualized skeletal structures are unchanged, with mild thoracic spondylosis and kyphosis. IMPRESSION: Increasing opacity in the right lateral costophrenic sulcus area, with trace right pleural fluid,  most likely due to a small right lower lobe pneumonia. Follow-up chest radiographs recommended to ensure clearing after treatment. Electronically Signed   By: Almira Bar M.D.   On: 08/03/2023 23:23    Pending Labs Unresulted Labs (From admission, onward)     Start     Ordered   08/05/23 0500  Basic metabolic panel  (Order Panel)  Daily,   R     Placed in "And" Linked Group   08/04/23 1735   08/05/23 0500  CBC with Differential/Platelet  (Order Panel)  Daily,   R     Placed in "And" Linked Group   08/04/23 1735            Vitals/Pain Today's Vitals   08/04/23 1800 08/04/23 1815 08/04/23 1830 08/04/23 1841  BP: 129/87 120/77 120/84   Pulse: (!) 121 (!) 113 (!) 110   Resp: (!) 28 (!) 29 (!) 24   Temp:   98.4 F (36.9 C)   TempSrc:   Oral   SpO2: 95% 96% 98%   Weight:      Height:      PainSc:    8     Isolation Precautions No active isolations  Medications Medications  lidocaine (LIDODERM) 5 % 1 patch (1 patch Transdermal Patch Applied 08/04/23 0113)  lactated ringers infusion (0 mL/hr Intravenous Stopped 08/04/23 1134)  enoxaparin (LOVENOX) injection 40 mg (40 mg Subcutaneous Given 08/04/23 0917)  cefTRIAXone (ROCEPHIN) 2 g in sodium chloride 0.9 % 100 mL IVPB (2 g Intravenous New Bag/Given 08/04/23 2112)  azithromycin (ZITHROMAX) 500 mg in sodium chloride 0.9 % 250 mL IVPB (0 mg Intravenous Stopped 08/04/23 0414)  acetaminophen (TYLENOL) tablet 650 mg (650 mg Oral Given 08/04/23 0612)    Or  acetaminophen (TYLENOL) suppository 650 mg ( Rectal See Alternative 08/04/23 0612)  traZODone (DESYREL) tablet 25 mg (25 mg Oral Given 08/04/23 2111)  magnesium hydroxide (MILK OF MAGNESIA) suspension 30 mL (has no administration in time range)  ondansetron (ZOFRAN) tablet 4 mg ( Oral See Alternative 08/04/23 0417)    Or  ondansetron (ZOFRAN) injection 4 mg (4 mg Intravenous Given 08/04/23 0417)  guaiFENesin (MUCINEX) 12 hr tablet 600 mg (600 mg Oral Given 08/04/23 2111)   chlorpheniramine-HYDROcodone (TUSSIONEX) 10-8 MG/5ML suspension 5 mL (5 mLs Oral Given 08/04/23 1841)  ketorolac (TORADOL) 15 MG/ML injection 15 mg (15 mg Intravenous Given 08/04/23 2112)  ipratropium-albuterol (DUONEB) 0.5-2.5 (3) MG/3ML nebulizer solution 3 mL (3 mLs Nebulization Not Given 08/04/23 2116)  lactated ringers infusion ( Intravenous New Bag/Given 08/04/23 1133)  cefTRIAXone (ROCEPHIN) 2 g in sodium chloride 0.9 % 100 mL IVPB (0 g Intravenous Stopped 08/04/23 0206)  iohexol (OMNIPAQUE) 350 MG/ML injection 75 mL (75 mLs Intravenous Contrast Given 08/04/23 0045)  morphine (PF) 4 MG/ML injection 4 mg (4 mg Intravenous Given 08/04/23 0116)  ondansetron (ZOFRAN) injection 4 mg (4 mg Intravenous Given 08/04/23 0115)  ketorolac (TORADOL) 15 MG/ML injection 15 mg (15 mg Intravenous Given 08/04/23 0115)  azithromycin (ZITHROMAX) tablet 250 mg (250  mg Oral Given 08/04/23 0136)  lactated ringers bolus 1,000 mL (0 mLs Intravenous Stopped 08/04/23 5784)    Mobility walks     Focused Assessments    R Recommendations: See Admitting Provider Note  Report given to:   Additional Notes:

## 2023-08-04 NOTE — ED Notes (Signed)
 Patient transported to CT

## 2023-08-04 NOTE — H&P (Addendum)
 [place]   PATIENT NAME: Edward Krause    MR#:  [id number]  DATE OF BIRTH:  [birth year]  DATE OF ADMISSION:  08/03/2023  PRIMARY CARE PHYSICIAN: Patient, No Pcp Per   Patient is coming from: Home  REQUESTING/REFERRING PHYSICIAN: Kommor, [place], MD  CHIEF COMPLAINT:   Chief Complaint  Patient presents with   Shortness of Breath    HISTORY OF PRESENT ILLNESS:  Edward Krause is a 38 y.o. Caucasian male with history of tobacco abuse with otherwise no significant medical history, who presented to the emergency room with acute onset of worsening dyspnea and cough.  He was seen in the ED yesterday and diagnosed with pneumonia with recent cough productive of yellowish sputum as well as dyspnea and wheezing for the last few days.  He had persistent tachycardia that time and ED physician wanted to pursue PE study at that time but the patient elected to receive antibiotics and monitor for improvement at home with oral antibiotics.  He was able to go to sleep last night but woke up with significant pleuritic chest pain on the right side and therefore return to the emergency room.  He admitted that he snorted recreational oxycodone last week.  He admitted to nausea and vomiting with his chest pain without abdominal pain or diarrhea or melena or bright red bleeding per rectum.  No bilious vomitus or hematemesis.Marland Kitchen  He admitted to tactile fever with cold chills.  No dysuria, oliguria or hematuria or flank pain.  ED Course: When he came to the ER, heart rate was 124 and temperature 100.3 with otherwise unremarkable vital signs.  Labs leukocytosis 17.5 with neutrophilia as well as mild anemia close to previous levels and thrombocytosis.  CMP was unremarkable.  EKG as reviewed by me : EKG on 2/16 showed normal sinus rhythm with a rate of 99. Imaging: Two-view chest x-ray showed the following: Increasing opacity in the right lateral costophrenic sulcus area, with trace right pleural fluid, most  likely due to a small right lower lobe pneumonia. Follow-up chest radiographs recommended to ensure clearing after treatment.  Chest CTA revealed the following: No evidence of pulmonary emboli.   Bilateral lower lobe and right middle lobe mucous plugging with focal infiltrate in the right lower lobe.   Scarring is seen in the right middle lobe related to a prior area of infiltrate.  The patient was given IV Rocephin and p.o. Zithromax.  4 mg of IV morphine sulfate and 4 mg of IV Zofran as well as 1 L bolus of IV lactated Ringer and DuoNeb.  He will be admitted to a medical telemetry bed for further evaluation and management.   PAST MEDICAL HISTORY:   Past Medical History:  Diagnosis Date   Back injury    July 2016   Back pain     PAST SURGICAL HISTORY:  History reviewed. No pertinent surgical history.  SOCIAL HISTORY:   Social History   Tobacco Use   Smoking status: Former    Current packs/day: 0.00    Average packs/day: 1 pack/day for 10.0 years (10.0 ttl pk-yrs)    Types: Cigarettes    Start date: 06/26/2008    Quit date: 06/26/2018    Years since quitting: 5.1   Smokeless tobacco: Never  Substance Use Topics   Alcohol use: No    FAMILY HISTORY:   Positive for MI in his grandfather.  DRUG ALLERGIES:   Allergies  Allergen Reactions   Poison Sumac Extract  REVIEW OF SYSTEMS:   ROS As per history of present illness. All pertinent systems were reviewed above. Constitutional, HEENT, cardiovascular, respiratory, GI, GU, musculoskeletal, neuro, psychiatric, endocrine, integumentary and hematologic systems were reviewed and are otherwise negative/unremarkable except for positive findings mentioned above in the HPI.   MEDICATIONS AT HOME:   Prior to Admission medications   Medication Sig Start Date End Date Taking? Authorizing Provider  amoxicillin (AMOXIL) 500 MG capsule Take 2 capsules (1,000 mg total) by mouth 3 (three) times daily. 08/02/23   Al Decant, PA-C  doxycycline (VIBRAMYCIN) 100 MG capsule Take 1 capsule (100 mg total) by mouth 2 (two) times daily. 08/02/23   Al Decant, PA-C  naproxen (NAPROSYN) 500 MG tablet Take 1 tablet (500 mg total) by mouth 2 (two) times daily. 01/06/23   Sabas Sous, MD  ondansetron (ZOFRAN ODT) 4 MG disintegrating tablet Take 1 tablet (4 mg total) by mouth every 8 (eight) hours as needed for nausea. 07/03/18   Eber Hong, MD  ondansetron (ZOFRAN) 4 MG tablet Take 1 tablet (4 mg total) by mouth every 6 (six) hours. 08/02/23   Al Decant, PA-C  oseltamivir (TAMIFLU) 75 MG capsule Take 1 capsule (75 mg total) by mouth every 12 (twelve) hours. 07/03/18   Eber Hong, MD      VITAL SIGNS:  Blood pressure (!) 134/91, pulse (!) 104, temperature 98.6 F (37 C), temperature source Oral, resp. rate 20, height 5\' 9"  (1.753 m), weight 56.7 kg, SpO2 98%.  PHYSICAL EXAMINATION:  Physical Exam  GENERAL:  38 y.o.-year-old Caucasian patient lying in the bed with no acute distress.  EYES: Pupils equal, round, reactive to light and accommodation. No scleral icterus. Extraocular muscles intact.  HEENT: Head atraumatic, normocephalic. Oropharynx and nasopharynx clear.  NECK:  Supple, no jugular venous distention. No thyroid enlargement, no tenderness.  LUNGS: Diminished bibasal  breath sounds with right basal and midlung zone crackles.  No use of accessory muscles of respiration.  CARDIOVASCULAR: Regular rate and rhythm, S1, S2 normal. No murmurs, rubs, or gallops.  ABDOMEN: Soft, nondistended, nontender. Bowel sounds present. No organomegaly or mass.  EXTREMITIES: No pedal edema, cyanosis, or clubbing.  NEUROLOGIC: Cranial nerves II through XII are intact. Muscle strength 5/5 in all extremities. Sensation intact. Gait not checked.  PSYCHIATRIC: The patient is alert and oriented x 3.  Normal affect and good eye contact. SKIN: No obvious rash, lesion, or ulcer.   LABORATORY PANEL:    CBC Recent Labs  Lab 08/04/23 0427  WBC 16.1*  HGB 10.4*  HCT 32.2*  PLT 332   ------------------------------------------------------------------------------------------------------------------  Chemistries  Recent Labs  Lab 08/03/23 2339 08/04/23 0427  NA 139 138  K 4.1 3.8  CL 103 103  CO2 27 24  GLUCOSE 103* 129*  BUN 12 10  CREATININE 0.84 0.76  CALCIUM 8.6* 8.2*  AST 21  --   ALT 14  --   ALKPHOS 71  --   BILITOT 0.5  --    ------------------------------------------------------------------------------------------------------------------  Cardiac Enzymes No results for input(s): "TROPONINI" in the last 168 hours. ------------------------------------------------------------------------------------------------------------------  RADIOLOGY:  CT Angio Chest PE W and/or Wo Contrast Result Date: 08/04/2023 CLINICAL DATA:  Shortness of breath for 2 days, initial encounter EXAM: CT ANGIOGRAPHY CHEST WITH CONTRAST TECHNIQUE: Multidetector CT imaging of the chest was performed using the standard protocol during bolus administration of intravenous contrast. Multiplanar CT image reconstructions and MIPs were obtained to evaluate the vascular anatomy. RADIATION DOSE REDUCTION: This exam  was performed according to the departmental dose-optimization program which includes automated exposure control, adjustment of the mA and/or kV according to patient size and/or use of iterative reconstruction technique. CONTRAST:  75mL OMNIPAQUE IOHEXOL 350 MG/ML SOLN COMPARISON:  Chest x-ray from the previous day. FINDINGS: Cardiovascular: Thoracic aorta is within normal limits. No aneurysmal dilatation or dissection is noted. The heart is not significantly enlarged. No significant coronary calcifications are noted. The pulmonary artery shows a normal branching pattern bilaterally. No intraluminal filling defect to suggest pulmonary embolism is noted. Mediastinum/Nodes: Thoracic inlet is within normal  limits. No hilar or mediastinal adenopathy is noted. The esophagus as visualized is within normal limits. Small hilar lymph nodes are noted bilaterally likely reactive in nature. Lungs/Pleura: Small right-sided pleural effusion is noted. The lungs are well aerated bilaterally. Considerable mucous plugging is noted in the lower lobes bilaterally right greater than left. Some associated focal infiltrate in the right lower lobe is seen. Similar findings are also noted in the right middle lobe. Scarring is noted in the medial right middle lobe related to prior masslike density. Upper Abdomen: Visualized upper abdomen is within normal limits. Musculoskeletal: No acute bony abnormality is seen. Review of the MIP images confirms the above findings. IMPRESSION: No evidence of pulmonary emboli. Bilateral lower lobe and right middle lobe mucous plugging with focal infiltrate in the right lower lobe. Scarring is seen in the right middle lobe related to a prior area of infiltrate. Electronically Signed   By: Alcide Clever M.D.   On: 08/04/2023 01:00   DG Chest 2 View Result Date: 08/03/2023 CLINICAL DATA:  Shortness of breath, chest pain, productive cough. EXAM: CHEST - 2 VIEW COMPARISON:  PA and lateral chest yesterday at 4:19 p.m. FINDINGS: The heart size and mediastinal contours are within normal limits. There is increasing opacity in the right lateral costophrenic sulcus area, trace right pleural fluid, opacity also localizing in the posterior chest base on the lateral view. Findings are most likely due to a small right lower lobe pneumonia. Remaining lungs are generally clear. The visualized skeletal structures are unchanged, with mild thoracic spondylosis and kyphosis. IMPRESSION: Increasing opacity in the right lateral costophrenic sulcus area, with trace right pleural fluid, most likely due to a small right lower lobe pneumonia. Follow-up chest radiographs recommended to ensure clearing after treatment. Electronically  Signed   By: Almira Bar M.D.   On: 08/03/2023 23:23   DG Chest 2 View Result Date: 08/02/2023 CLINICAL DATA:  Cough.  Shortness of breath. EXAM: CHEST - 2 VIEW COMPARISON:  01/06/2023. FINDINGS: There are patchy nonspecific opacities overlying the right lateral costophrenic angle region, which may represent atelectasis and/or pneumonitis. Correlate clinically. Bilateral lung fields are otherwise clear. No dense consolidation or lung collapse. No pulmonary edema. Bilateral costophrenic angles are clear. Normal cardio-mediastinal silhouette. No acute osseous abnormalities. The soft tissues are within normal limits. IMPRESSION: Patchy nonspecific opacities in the right lower lobe, which may represent atelectasis and/or pneumonitis. Correlate clinically. Electronically Signed   By: Jules Schick M.D.   On: 08/02/2023 17:11      IMPRESSION AND PLAN:  Assessment and Plan: * Sepsis due to pneumonia (HCC) - Sepsis manifested by leukocytosis, tachycardia and tachypnea. - This is secondary to right lower lobe community-acquired pneumonia with worsening consolidation. - The patient will be admitted to a medical telemetry bed. - Will continue antibiotic therapy with IV Rocephin and Zithromax. - Mucolytic therapy be provided as well as duo nebs q.i.d. and q.4 hours p.r.n. -  We will follow blood cultures. - O2 protocol will be followed. - He is currently placed on oxygen though his pulse oximetry was 96% on room air.   Pleuritic chest pain - We will utilize as needed IV Toradol.  Opiate abuse, episodic (HCC) - He was advised to quit snoring opiates.       DVT prophylaxis: Lovenox.  Advanced Care Planning:  Code Status: full code.  Family Communication:  The plan of care was discussed in details with the patient (and family). I answered all questions. The patient agreed to proceed with the above mentioned plan. Further management will depend upon hospital course. Disposition Plan: Back to  previous home environment Consults called: none.  All the records are reviewed and case discussed with ED provider.  Status is: Inpatient    At the time of the admission, it appears that the appropriate admission status for this patient is inpatient.  This is judged to be reasonable and necessary in order to provide the required intensity of service to ensure the patient's safety given the presenting symptoms, physical exam findings and initial radiographic and laboratory data in the context of comorbid conditions.  The patient requires inpatient status due to high intensity of service, high risk of further deterioration and high frequency of surveillance required.  I certify that at the time of admission, it is my clinical judgment that the patient will require inpatient hospital care extending more than 2 midnights.                            Dispo: The patient is from: Home              Anticipated d/c is to: Home              Patient currently is not medically stable to d/c.              Difficult to place patient: No  Hannah Beat M.D on 08/04/2023 at 7:27 AM  [facility]   From 7 PM-7 AM, contact night-coverage [email or web address]  CC: Primary care physician; Patient, No Pcp Per

## 2023-08-04 NOTE — Assessment & Plan Note (Signed)
-   He was advised to quit snoring opiates.

## 2023-08-04 NOTE — ED Provider Notes (Signed)
 [place] EMERGENCY DEPARTMENT AT [facility] Provider Note  CSN: [id number] Arrival date & time: 08/03/23 2157  Chief Complaint(s) Shortness of Breath  HPI Edward Krause is a 38 y.o. male who presents emergency room for evaluation of cough and shortness of breath.  Patient seen in the Emergency Department yesterday and diagnosed with pneumonia.  He had persistent tachycardia at that time and ER providers wanted to pursue PE study at that time but patient elected to receive antibiotics and monitor for improvement at home with oral antibiotics.  Patient was able to go to sleep last night but awoke with significant pleuritic chest pain on the right and return to the emergency department for further evaluation.  Patient states that last week he did snort recreational oxycodone.  Denies abdominal pain, nausea, vomiting, headache or other systemic symptoms.   Past Medical History Past Medical History:  Diagnosis Date   Back injury    July 2016   Back pain    There are no active problems to display for this patient.  Home Medication(s) Prior to Admission medications   Medication Sig Start Date End Date Taking? Authorizing Provider  amoxicillin (AMOXIL) 500 MG capsule Take 2 capsules (1,000 mg total) by mouth 3 (three) times daily. 08/02/23   Al Decant, PA-C  doxycycline (VIBRAMYCIN) 100 MG capsule Take 1 capsule (100 mg total) by mouth 2 (two) times daily. 08/02/23   Al Decant, PA-C  naproxen (NAPROSYN) 500 MG tablet Take 1 tablet (500 mg total) by mouth 2 (two) times daily. 01/06/23   Sabas Sous, MD  ondansetron (ZOFRAN ODT) 4 MG disintegrating tablet Take 1 tablet (4 mg total) by mouth every 8 (eight) hours as needed for nausea. 07/03/18   Eber Hong, MD  ondansetron (ZOFRAN) 4 MG tablet Take 1 tablet (4 mg total) by mouth every 6 (six) hours. 08/02/23   Al Decant, PA-C  oseltamivir (TAMIFLU) 75 MG capsule Take 1 capsule (75 mg total) by mouth  every 12 (twelve) hours. 07/03/18   Eber Hong, MD                                                                                                                                    Past Surgical History History reviewed. No pertinent surgical history. Family History History reviewed. No pertinent family history.  Social History Social History   Tobacco Use   Smoking status: Former    Current packs/day: 0.00    Average packs/day: 1 pack/day for 10.0 years (10.0 ttl pk-yrs)    Types: Cigarettes    Start date: 06/26/2008    Quit date: 06/26/2018    Years since quitting: 5.1   Smokeless tobacco: Never  Vaping Use   Vaping status: Never Used  Substance Use Topics   Alcohol use: No   Drug use: No   Allergies Poison sumac extract  Review of Systems Review of Systems  Respiratory:  Positive for cough and shortness of breath.   Cardiovascular:  Positive for chest pain.    Physical Exam Vital Signs  I have reviewed the triage vital signs BP 122/79 (BP Location: Right Arm)   Pulse (!) 115   Temp 98.3 F (36.8 C) (Oral)   Resp (!) 22   Ht 5\' 9"  (1.753 m)   Wt 56.7 kg   SpO2 98%   BMI 18.46 kg/m   Physical Exam Constitutional:      General: He is not in acute distress.    Appearance: Normal appearance.  HENT:     Head: Normocephalic and atraumatic.     Nose: No congestion or rhinorrhea.  Eyes:     General:        Right eye: No discharge.        Left eye: No discharge.     Extraocular Movements: Extraocular movements intact.     Pupils: Pupils are equal, round, and reactive to light.  Cardiovascular:     Rate and Rhythm: Normal rate and regular rhythm.     Heart sounds: No murmur heard. Pulmonary:     Effort: No respiratory distress.     Breath sounds: Wheezing and rales present.  Abdominal:     General: There is no distension.     Tenderness: There is no abdominal tenderness.  Musculoskeletal:        General: Normal range of motion.     Cervical back:  Normal range of motion.  Skin:    General: Skin is warm and dry.  Neurological:     General: No focal deficit present.     Mental Status: He is alert.     ED Results and Treatments Labs (all labs ordered are listed, but only abnormal results are displayed) Labs Reviewed  COMPREHENSIVE METABOLIC PANEL - Abnormal; Notable for the following components:      Result Value   Glucose, Bld 103 (*)    Calcium 8.6 (*)    All other components within normal limits  CBC WITH DIFFERENTIAL/PLATELET - Abnormal; Notable for the following components:   WBC 17.5 (*)    RBC 4.04 (*)    Hemoglobin 12.5 (*)    HCT 38.1 (*)    Platelets 438 (*)    Neutro Abs 12.6 (*)    Monocytes Absolute 1.7 (*)    All other components within normal limits                                                                                                                          Radiology DG Chest 2 View Result Date: 08/03/2023 CLINICAL DATA:  Shortness of breath, chest pain, productive cough. EXAM: CHEST - 2 VIEW COMPARISON:  PA and lateral chest yesterday at 4:19 p.m. FINDINGS: The heart size and mediastinal contours are within normal limits. There is increasing opacity in the right lateral costophrenic sulcus area, trace right pleural fluid, opacity also localizing in the posterior chest  base on the lateral view. Findings are most likely due to a small right lower lobe pneumonia. Remaining lungs are generally clear. The visualized skeletal structures are unchanged, with mild thoracic spondylosis and kyphosis. IMPRESSION: Increasing opacity in the right lateral costophrenic sulcus area, with trace right pleural fluid, most likely due to a small right lower lobe pneumonia. Follow-up chest radiographs recommended to ensure clearing after treatment. Electronically Signed   By: Almira Bar M.D.   On: 08/03/2023 23:23    Pertinent labs & imaging results that were available during my care of the patient were reviewed by me  and considered in my medical decision making (see MDM for details).  Medications Ordered in ED Medications  cefTRIAXone (ROCEPHIN) 2 g in sodium chloride 0.9 % 100 mL IVPB (has no administration in time range)  azithromycin (ZITHROMAX) 250 mg in dextrose 5 % 125 mL IVPB (has no administration in time range)  iohexol (OMNIPAQUE) 350 MG/ML injection 75 mL (has no administration in time range)                                                                                                                                     Procedures .Critical Care  Performed by: Glendora Score, MD Authorized by: Glendora Score, MD   Critical care provider statement:    Critical care time (minutes):  30   Critical care was necessary to treat or prevent imminent or life-threatening deterioration of the following conditions:  Sepsis   Critical care was time spent personally by me on the following activities:  Development of treatment plan with patient or surrogate, discussions with consultants, evaluation of patient's response to treatment, examination of patient, ordering and review of laboratory studies, ordering and review of radiographic studies, ordering and performing treatments and interventions, pulse oximetry, re-evaluation of patient's condition and review of old charts   (including critical care time)  Medical Decision Making / ED Course   This patient presents to the ED for concern of cough, shortness of breath, chest pain, this involves an extensive number of treatment options, and is a complaint that carries with it a high risk of complications and morbidity.  The differential diagnosis includes Pe, PTX, Pulmonary Edema, ARDS, COPD/Asthma, ACS, CHF exacerbation, Arrhythmia, Pericardial Effusion/Tamponade, Anemia, Sepsis, Acidosis/Hypercapnia, Anxiety, Viral URI  MDM: Patient seen emergency room for evaluation of shortness of breath.  Physical exam reveals a tachycardic, uncomfortable patient  with rales and wheezing in the right lower lobe.  Laboratory valuation with leukocytosis to 17.5, hemoglobin 12.5 but is otherwise unremarkable.  Patient meets SIRS criteria and blood cultures and lactic acid ordered.  Ceftriaxone azithromycin ordered.  Fluid resuscitation ordered.  Chest x-ray with worsening pneumonia.  PE study reassuringly negative for PE but does show mucous plugging associated with right lower lobe pneumonia.  Will require hospital admission for tachycardia and concern for underlying bacteremia secondary to pneumonia.  Patient admitted.  Additional history obtained: -Additional history obtained from wife -External records from outside source obtained and reviewed including: Chart review including previous notes, labs, imaging, consultation notes   Lab Tests: -I ordered, reviewed, and interpreted labs.   The pertinent results include:   Labs Reviewed  COMPREHENSIVE METABOLIC PANEL - Abnormal; Notable for the following components:      Result Value   Glucose, Bld 103 (*)    Calcium 8.6 (*)    All other components within normal limits  CBC WITH DIFFERENTIAL/PLATELET - Abnormal; Notable for the following components:   WBC 17.5 (*)    RBC 4.04 (*)    Hemoglobin 12.5 (*)    HCT 38.1 (*)    Platelets 438 (*)    Neutro Abs 12.6 (*)    Monocytes Absolute 1.7 (*)    All other components within normal limits      Imaging Studies ordered: I ordered imaging studies including chest x-ray, CT PE I independently visualized and interpreted imaging. I agree with the radiologist interpretation   Medicines ordered and prescription drug management: Meds ordered this encounter  Medications   cefTRIAXone (ROCEPHIN) 2 g in sodium chloride 0.9 % 100 mL IVPB    Antibiotic Indication::   CAP   azithromycin (ZITHROMAX) 250 mg in dextrose 5 % 125 mL IVPB    Antibiotic Indication::   CAP   iohexol (OMNIPAQUE) 350 MG/ML injection 75 mL    -I have reviewed the patients home  medicines and have made adjustments as needed  Critical interventions Fluids, antibiotics    Cardiac Monitoring: The patient was maintained on a cardiac monitor.  I personally viewed and interpreted the cardiac monitored which showed an underlying rhythm of: Sinus tachycardia  Social Determinants of Health:  Factors impacting patients care include: Snorted recreational oxycodone last week   Reevaluation: After the interventions noted above, I reevaluated the patient and found that they have :improved  Co morbidities that complicate the patient evaluation  Past Medical History:  Diagnosis Date   Back injury    July 2016   Back pain       Dispostion: I considered admission for this patient, and patient require hospital admission for pneumonia and concern for underlying bacteremia     Final Clinical Impression(s) / ED Diagnoses Final diagnoses:  None     @PCDICTATION @    Glendora Score, MD 08/04/23 [phone]

## 2023-08-04 NOTE — Progress Notes (Signed)
   08/04/23 0842  TOC Brief Assessment  Insurance and Status Reviewed  Patient has primary care physician No  Home environment has been reviewed From home  Prior level of function: independent  Prior/Current Home Services No current home services  Social Drivers of Health Review SDOH reviewed no interventions necessary  Readmission risk has been reviewed Yes  Transition of care needs no transition of care needs at this time     PCP list added to pt's AVS.  Transition of Care Department (TOC) has reviewed patient and no TOC needs have been identified at this time. We will continue to monitor patient advancement through interdisciplinary progression rounds. If new patient transition needs arise, please place a TOC consult.

## 2023-08-04 NOTE — Discharge Instructions (Signed)

## 2023-08-04 NOTE — Assessment & Plan Note (Signed)
-   We will utilize as needed IV Toradol.

## 2023-08-04 NOTE — Assessment & Plan Note (Addendum)
-   Sepsis manifested by leukocytosis, tachycardia and tachypnea. - This is secondary to right lower lobe community-acquired pneumonia with worsening consolidation. - The patient will be admitted to a medical telemetry bed. - Will continue antibiotic therapy with IV Rocephin and Zithromax. - Mucolytic therapy be provided as well as duo nebs q.i.d. and q.4 hours p.r.n. - We will follow blood cultures. - O2 protocol will be followed. - He is currently placed on oxygen though his pulse oximetry was 96% on room air.

## 2023-08-04 NOTE — Progress Notes (Addendum)
 38 year old white male known tobacco recent ED visit 2/16 with cough diagnosed with pneumonia-sent home at patient insistence with oral amoxicillin Doxycycline as he did not wish to be worked up for PE Apparently does snort recreational oxycodone  present with shortness of breath tachycardia discomfort wheezing right lower lungs sodium 139 potassium 4.1 BUN/creatinine 12/0.8 WBC 17.5 hemoglobin 12.5 platelet 438 CT chest no pulmonary emboli bilateral lower lobe mucous plugging with infiltrate RLL scarring RML related to prior infiltrate  Admitted for failed outpatient management PNA-started on azithromycin and ceftriaxone given Toradol for pain  O/e  BP 122/78 (BP Location: Left Arm)   Pulse (!) 114   Temp 98.5 F (36.9 C) (Oral)   Resp 20   Ht 5\' 9"  (1.753 m)   Wt 56.7 kg   SpO2 97%   BMI 18.46 kg/m  Dischevelled, poorly kempt No icterus no pallor no wheeze no rales no rhonchi ROM intact Mild rales posterolaterally no wheeze On oxygen No lower extremity edema  Failed outpatient management pneumonia-PE ruled out --possible concern for inhalation injury secondary to oxycodone?  Other inhalations Continue antibiotics as above no opiates given prior history starting oxycodone de-escalate in 24 hours desat screen today IV fluid cut back to 75 cc/8, can use Desenex Mucinex albuterol inhalers Anticipate 24 to 48 hours in hospital and then likely after de-escalation can discharge home

## 2023-08-04 NOTE — ED Notes (Signed)
 Pt at approximately 1/4 lunch tray.  Wife at bedside. Pt denies needs.

## 2023-08-05 DIAGNOSIS — A419 Sepsis, unspecified organism: Secondary | ICD-10-CM | POA: Diagnosis not present

## 2023-08-05 DIAGNOSIS — J189 Pneumonia, unspecified organism: Secondary | ICD-10-CM | POA: Diagnosis not present

## 2023-08-05 LAB — BASIC METABOLIC PANEL
Anion gap: 8 (ref 5–15)
BUN: 10 mg/dL (ref 6–20)
CO2: 24 mmol/L (ref 22–32)
Calcium: 7.7 mg/dL — ABNORMAL LOW (ref 8.9–10.3)
Chloride: 103 mmol/L (ref 98–111)
Creatinine, Ser: 0.89 mg/dL (ref 0.61–1.24)
GFR, Estimated: >60 mL/min (ref >60)
Glucose, Bld: 90 mg/dL (ref 70–99)
Potassium: 3.4 mmol/L — ABNORMAL LOW (ref 3.5–5.1)
Sodium: 135 mmol/L (ref 135–145)

## 2023-08-05 LAB — CBC WITH DIFFERENTIAL/PLATELET
Abs Immature Granulocytes: 0.05 10*3/uL (ref 0.00–0.07)
Basophils Absolute: 0.1 10*3/uL (ref 0.0–0.1)
Basophils Relative: 1 %
Eosinophils Absolute: 0.1 10*3/uL (ref 0.0–0.5)
Eosinophils Relative: 0 %
HCT: 31.2 % — ABNORMAL LOW (ref 39.0–52.0)
Hemoglobin: 10.5 g/dL — ABNORMAL LOW (ref 13.0–17.0)
Immature Granulocytes: 0 %
Lymphocytes Relative: 20 %
Lymphs Abs: 2.8 10*3/uL (ref 0.7–4.0)
MCH: 31.4 pg (ref 26.0–34.0)
MCHC: 33.7 g/dL (ref 30.0–36.0)
MCV: 93.4 fL (ref 80.0–100.0)
Monocytes Absolute: 1.5 10*3/uL — ABNORMAL HIGH (ref 0.1–1.0)
Monocytes Relative: 11 %
Neutro Abs: 9.7 10*3/uL — ABNORMAL HIGH (ref 1.7–7.7)
Neutrophils Relative %: 68 %
Platelets: 331 10*3/uL (ref 150–400)
RBC: 3.34 MIL/uL — ABNORMAL LOW (ref 4.22–5.81)
RDW: 12.1 % (ref 11.5–15.5)
WBC: 14.1 10*3/uL — ABNORMAL HIGH (ref 4.0–10.5)
nRBC: 0 % (ref 0.0–0.2)

## 2023-08-05 MED ORDER — ALBUTEROL SULFATE HFA 108 (90 BASE) MCG/ACT IN AERS: 2.0000 | INHALATION_SPRAY | Freq: Four times a day (QID) | RESPIRATORY_TRACT | 2 refills | Status: AC | PRN

## 2023-08-05 MED ORDER — DILTIAZEM HCL 25 MG/5ML IV SOLN
5.0000 mg | Freq: Once | INTRAVENOUS | Status: AC
  Administered 2023-08-05: 5 mg via INTRAVENOUS
  Filled 2023-08-05: qty 5

## 2023-08-05 MED ORDER — AMOXICILLIN-POT CLAVULANATE 875-125 MG PO TABS: 1.0000 | ORAL_TABLET | Freq: Two times a day (BID) | ORAL | 0 refills | Status: AC

## 2023-08-05 MED ORDER — SODIUM CHLORIDE 0.9 % IV BOLUS
500.0000 mL | Freq: Once | INTRAVENOUS | Status: AC
  Administered 2023-08-05: 500 mL via INTRAVENOUS

## 2023-08-05 MED ORDER — AZITHROMYCIN 500 MG PO TABS: 500.0000 mg | ORAL_TABLET | Freq: Every day | ORAL | 0 refills | Status: AC

## 2023-08-05 MED ORDER — LEVALBUTEROL HCL 0.63 MG/3ML IN NEBU
0.6300 mg | INHALATION_SOLUTION | Freq: Two times a day (BID) | RESPIRATORY_TRACT | Status: DC
  Administered 2023-08-05: 0.63 mg via RESPIRATORY_TRACT
  Filled 2023-08-05: qty 3

## 2023-08-05 MED ORDER — GUAIFENESIN ER 600 MG PO TB12: 600.0000 mg | ORAL_TABLET | Freq: Two times a day (BID) | ORAL | 0 refills | Status: AC | PRN

## 2023-08-05 MED ORDER — MORPHINE SULFATE (PF) 2 MG/ML IV SOLN
2.0000 mg | INTRAVENOUS | Status: DC | PRN
  Administered 2023-08-05 (×2): 2 mg via INTRAVENOUS
  Filled 2023-08-05 (×2): qty 1

## 2023-08-05 NOTE — Progress Notes (Signed)
   08/05/23 0200  Assess: if the MEWS score is Yellow or Red  Were vital signs accurate and taken at a resting state? Yes  Does the patient meet 2 or more of the SIRS criteria? No  MEWS guidelines implemented  Yes, yellow  Treat  MEWS Interventions Considered administering scheduled or prn medications/treatments as ordered  Take Vital Signs  Increase Vital Sign Frequency  Yellow: Q2hr x1, continue Q4hrs until patient remains green for 12hrs  Escalate  MEWS: Escalate Yellow: Discuss with charge nurse and consider notifying provider and/or RRT  Notify: Charge Nurse/RN  Name of Charge Nurse/RN Notified [place], Charity fundraiser

## 2023-08-05 NOTE — Plan of Care (Signed)
  Problem: Education: Goal: Knowledge of General Education information will improve Description: Including pain rating scale, medication(s)/side effects and non-pharmacologic comfort measures Outcome: Progressing   Problem: Pain Managment: Goal: General experience of comfort will improve and/or be controlled Outcome: Progressing

## 2023-08-05 NOTE — Discharge Summary (Signed)
 Physician Discharge Summary   Patient: Edward Krause MRN: [id number] DOB: [birth year]  Admit date:     08/03/2023  Discharge date: 08/05/23  Discharge Physician: Deanna Artis   PCP: Patient, No Pcp Per   Recommendations at discharge:   At this time patient will be discharged home.  If you experience any symptoms such as fever, vomiting, shortness of breath, chest pain, abdominal pain, or other concerning symptoms, please call your primary care provider or go to the emergency department immediately.  Discharge Diagnoses: Principal Problem:   Sepsis due to pneumonia [facility]) Active Problems:   Pleuritic chest pain   Opiate abuse, episodic (HCC)   Pneumonia of right lower lobe due to infectious organism  Resolved Problems:   * No resolved hospital problems. [facility] Course: No notes on file  Assessment and Plan: Concern for sepsis - Leukocytosis, tachycardia, pneumonia on presentation giving concern for sepsis.  Blood cultures, empiric antibiotics, IV fluid bolus given.  Pressure well-controlled.  Appears to be resolved at this time.  Community acquired pneumonia - Failed outpatient ABX regiment.  Possible exacerbation with inhalation injury secondary to snorting oxycodone.  Was given empiric IV antibiotic therapy, nebulizer therapy, supplemental O2.  Throughout the course hospital stay, patient showing marked improvement.  No longer on supplemental oxygen.  Feeling improved, coughing less, less purulent sputum.  Motivated to be discharged home.  Will transition patient over to p.o. Augmentin plus azithromycin to take for the next 5 days upon discharge.  Will also give prescription for albuterol inhaler and Mucinex.  Would abstain from snorting/inhaling oxycodone and the like.  Pleuritic chest pain - Can [place] to above.  Improved after IV Toradol.  Can take OTC NSAIDs as needed.  Opiate abuse, episodic (HCC) - He was advised to quit snoring opiates.         Consultants:  None Procedures performed: None Disposition: Home Diet recommendation:  Discharge Diet Orders (From admission, onward)     Start     Ordered   08/05/23 0000  Diet - low sodium heart healthy        08/05/23 1128           Regular diet DISCHARGE MEDICATION: Allergies as of 08/05/2023       Reactions   Poison Sumac Extract         Medication List     STOP taking these medications    amoxicillin 500 MG capsule Commonly known as: AMOXIL   doxycycline 100 MG capsule Commonly known as: VIBRAMYCIN   ondansetron 4 MG tablet Commonly known as: ZOFRAN       TAKE these medications    albuterol 108 (90 Base) MCG/ACT inhaler Commonly known as: VENTOLIN HFA Inhale 2 puffs into the lungs every 6 (six) hours as needed for wheezing or shortness of breath.   amoxicillin-clavulanate 875-125 MG tablet Commonly known as: AUGMENTIN Take 1 tablet by mouth 2 (two) times daily.   azithromycin 500 MG tablet Commonly known as: Zithromax Take 1 tablet (500 mg total) by mouth daily for 5 days.   guaiFENesin 600 MG 12 hr tablet Commonly known as: MUCINEX Take 1 tablet (600 mg total) by mouth 2 (two) times daily as needed.        Discharge Exam: Filed Weights   08/03/23 2202  Weight: 56.7 kg   GENERAL:  Alert, pleasant, no acute distress, disheveled HEENT:  EOMI CARDIOVASCULAR:  RRR, no murmurs appreciated RESPIRATORY: Mild cough, clear to auscultation, no wheezing, rales, or  rhonchi GASTROINTESTINAL:  Soft, nontender, nondistended EXTREMITIES:  No LE edema bilaterally NEURO:  No new focal deficits appreciated SKIN:  No rashes noted PSYCH:  Appropriate mood and affect    Condition at discharge: improving  The results of significant diagnostics from this hospitalization (including imaging, microbiology, ancillary and laboratory) are listed below for reference.   Imaging Studies: CT Angio Chest PE W and/or Wo Contrast Result Date: 08/04/2023 CLINICAL DATA:   Shortness of breath for 2 days, initial encounter EXAM: CT ANGIOGRAPHY CHEST WITH CONTRAST TECHNIQUE: Multidetector CT imaging of the chest was performed using the standard protocol during bolus administration of intravenous contrast. Multiplanar CT image reconstructions and MIPs were obtained to evaluate the vascular anatomy. RADIATION DOSE REDUCTION: This exam was performed according to the departmental dose-optimization program which includes automated exposure control, adjustment of the mA and/or kV according to patient size and/or use of iterative reconstruction technique. CONTRAST:  75mL OMNIPAQUE IOHEXOL 350 MG/ML SOLN COMPARISON:  Chest x-ray from the previous day. FINDINGS: Cardiovascular: Thoracic aorta is within normal limits. No aneurysmal dilatation or dissection is noted. The heart is not significantly enlarged. No significant coronary calcifications are noted. The pulmonary artery shows a normal branching pattern bilaterally. No intraluminal filling defect to suggest pulmonary embolism is noted. Mediastinum/Nodes: Thoracic inlet is within normal limits. No hilar or mediastinal adenopathy is noted. The esophagus as visualized is within normal limits. Small hilar lymph nodes are noted bilaterally likely reactive in nature. Lungs/Pleura: Small right-sided pleural effusion is noted. The lungs are well aerated bilaterally. Considerable mucous plugging is noted in the lower lobes bilaterally right greater than left. Some associated focal infiltrate in the right lower lobe is seen. Similar findings are also noted in the right middle lobe. Scarring is noted in the medial right middle lobe related to prior masslike density. Upper Abdomen: Visualized upper abdomen is within normal limits. Musculoskeletal: No acute bony abnormality is seen. Review of the MIP images confirms the above findings. IMPRESSION: No evidence of pulmonary emboli. Bilateral lower lobe and right middle lobe mucous plugging with focal  infiltrate in the right lower lobe. Scarring is seen in the right middle lobe related to a prior area of infiltrate. Electronically Signed   By: Alcide Clever M.D.   On: 08/04/2023 01:00   DG Chest 2 View Result Date: 08/03/2023 CLINICAL DATA:  Shortness of breath, chest pain, productive cough. EXAM: CHEST - 2 VIEW COMPARISON:  PA and lateral chest yesterday at 4:19 p.m. FINDINGS: The heart size and mediastinal contours are within normal limits. There is increasing opacity in the right lateral costophrenic sulcus area, trace right pleural fluid, opacity also localizing in the posterior chest base on the lateral view. Findings are most likely due to a small right lower lobe pneumonia. Remaining lungs are generally clear. The visualized skeletal structures are unchanged, with mild thoracic spondylosis and kyphosis. IMPRESSION: Increasing opacity in the right lateral costophrenic sulcus area, with trace right pleural fluid, most likely due to a small right lower lobe pneumonia. Follow-up chest radiographs recommended to ensure clearing after treatment. Electronically Signed   By: Almira Bar M.D.   On: 08/03/2023 23:23   DG Chest 2 View Result Date: 08/02/2023 CLINICAL DATA:  Cough.  Shortness of breath. EXAM: CHEST - 2 VIEW COMPARISON:  01/06/2023. FINDINGS: There are patchy nonspecific opacities overlying the right lateral costophrenic angle region, which may represent atelectasis and/or pneumonitis. Correlate clinically. Bilateral lung fields are otherwise clear. No dense consolidation or lung collapse. No pulmonary  edema. Bilateral costophrenic angles are clear. Normal cardio-mediastinal silhouette. No acute osseous abnormalities. The soft tissues are within normal limits. IMPRESSION: Patchy nonspecific opacities in the right lower lobe, which may represent atelectasis and/or pneumonitis. Correlate clinically. Electronically Signed   By: Jules Schick M.D.   On: 08/02/2023 17:11     Microbiology: Results for orders placed or performed during the hospital encounter of 08/03/23  Blood culture (routine x 2)     Status: None (Preliminary result)   Collection Time: 08/04/23  1:28 AM   Specimen: BLOOD  Result Value Ref Range Status   Specimen Description BLOOD LEFT ANTECUBITAL  Final   Special Requests   Final    BOTTLES DRAWN AEROBIC AND ANAEROBIC Blood Culture adequate volume   Culture   Final    NO GROWTH 1 DAY Performed at [facility], [address] [place], [place] [address]    Report Status PENDING  Incomplete  Blood culture (routine x 2)     Status: None (Preliminary result)   Collection Time: 08/04/23  1:28 AM   Specimen: BLOOD  Result Value Ref Range Status   Specimen Description BLOOD BLOOD LEFT FOREARM  Final   Special Requests   Final    BOTTLES DRAWN AEROBIC AND ANAEROBIC Blood Culture adequate volume   Culture   Final    NO GROWTH 1 DAY Performed at [facility], [address] [place], [place] [address]    Report Status PENDING  Incomplete    Labs: CBC: Recent Labs  Lab 08/02/23 2058 08/03/23 2339 08/04/23 0427 08/05/23 0340  WBC 17.6* 17.5* 16.1* 14.1*  NEUTROABS  --  12.6*  --  9.7*  HGB 12.0* 12.5* 10.4* 10.5*  HCT 35.9* 38.1* 32.2* 31.2*  MCV 92.8 94.3 93.3 93.4  PLT 411* 438* 332 331   Basic Metabolic Panel: Recent Labs  Lab 08/02/23 2058 08/03/23 2339 08/04/23 0427 08/05/23 0340  NA 137 139 138 135  K 3.7 4.1 3.8 3.4*  CL 104 103 103 103  CO2 22 27 24 24   GLUCOSE 125* 103* 129* 90  BUN 10 12 10 10   CREATININE 0.84 0.84 0.76 0.89  CALCIUM 8.1* 8.6* 8.2* 7.7*   Liver Function Tests: Recent Labs  Lab 08/03/23 2339  AST 21  ALT 14  ALKPHOS 71  BILITOT 0.5  PROT 7.9  ALBUMIN 3.6   CBG: No results for input(s): "GLUCAP" in the last 168 hours.  Discharge time spent: less than 30 minutes.  Signed: Deanna Artis, DO [facility] 08/05/2023

## 2023-08-05 NOTE — Progress Notes (Signed)
 Patient is complaining that he is having trouble with clearing secretions.  Patient is already getting mucinex, so I gave patient a cool aerosol treatment and will give patient a flutter and explain how to use it.  He states it hurts when he coughs and I explained that he may have to deal with that for a little while till we can get him coughing up his secretions and get him better.  RN made aware and at bedside.

## 2023-08-05 NOTE — Progress Notes (Signed)
 Patient stood at side of bed to use urinal and HR jumped to 174. Patient now sitting on side of bed, HR in 130s. O2 sat is 98% but patient is working to catch breath. Secure chat sent to Dr. Arville Care, no new orders given at this time. Will continue to monitor closely.

## 2023-08-05 NOTE — Progress Notes (Signed)
 Patient HR continuing to stay in 120s at rest after pain medication given. Dr. Arville Care notified through secure chat. Will continue to monitor closely.

## 2023-08-05 NOTE — Progress Notes (Signed)
 Spoke with Dr. Sharlene Dory regarding patient's walk without o2. His sats stayed about 93-94%, however his hr was 130-138 still. Stationary he's staying at about 117; did not get light headed during our walk. No new orders; okay to d/c per Dr. Sharlene Dory. Patient IV removed.

## 2023-08-09 LAB — CULTURE, BLOOD (ROUTINE X 2)
Culture: NO GROWTH
Culture: NO GROWTH
Special Requests: ADEQUATE
Special Requests: ADEQUATE
